# Patient Record
Sex: Female | Born: 1955
Health system: Southern US, Community
[De-identification: ages and names within clinical notes are randomized; demographics above are authoritative.]

## PROBLEM LIST (undated history)

## (undated) DIAGNOSIS — G629 Polyneuropathy, unspecified: Secondary | ICD-10-CM

## (undated) DIAGNOSIS — S82899A Other fracture of unspecified lower leg, initial encounter for closed fracture: Secondary | ICD-10-CM

## (undated) DIAGNOSIS — I10 Essential (primary) hypertension: Secondary | ICD-10-CM

## (undated) DIAGNOSIS — E119 Type 2 diabetes mellitus without complications: Secondary | ICD-10-CM

## (undated) DIAGNOSIS — K5790 Diverticulosis of intestine, part unspecified, without perforation or abscess without bleeding: Secondary | ICD-10-CM

## (undated) DIAGNOSIS — R7303 Prediabetes: Secondary | ICD-10-CM

## (undated) DIAGNOSIS — M069 Rheumatoid arthritis, unspecified: Secondary | ICD-10-CM

## (undated) HISTORY — PX: BREAST SURGERY: SHX581

## (undated) HISTORY — PX: BREAST CYST EXCISION: SHX579

## (undated) HISTORY — PX: NECK MASS EXCISION: SHX2079

## (undated) HISTORY — PX: CYST EXCISION: SHX5701

## (undated) HISTORY — PX: ABDOMINAL HYSTERECTOMY: SHX81

## (undated) HISTORY — PX: TOTAL KNEE ARTHROPLASTY: SHX125

---

## 2006-02-23 ENCOUNTER — Emergency Department: Payer: Self-pay | Admitting: Internal Medicine

## 2008-11-13 ENCOUNTER — Ambulatory Visit: Payer: Self-pay | Admitting: Family Medicine

## 2009-03-08 ENCOUNTER — Ambulatory Visit: Payer: Self-pay | Admitting: Rheumatology

## 2011-10-13 ENCOUNTER — Ambulatory Visit: Payer: Self-pay

## 2011-11-30 ENCOUNTER — Ambulatory Visit: Payer: Self-pay | Admitting: Surgery

## 2013-10-30 DIAGNOSIS — M069 Rheumatoid arthritis, unspecified: Secondary | ICD-10-CM | POA: Insufficient documentation

## 2013-10-30 DIAGNOSIS — M1990 Unspecified osteoarthritis, unspecified site: Secondary | ICD-10-CM | POA: Insufficient documentation

## 2013-10-30 DIAGNOSIS — K5790 Diverticulosis of intestine, part unspecified, without perforation or abscess without bleeding: Secondary | ICD-10-CM | POA: Insufficient documentation

## 2013-11-27 ENCOUNTER — Ambulatory Visit: Payer: Self-pay | Admitting: Rheumatology

## 2013-12-27 ENCOUNTER — Ambulatory Visit: Payer: Self-pay | Admitting: Physical Medicine and Rehabilitation

## 2014-01-16 DIAGNOSIS — M4317 Spondylolisthesis, lumbosacral region: Secondary | ICD-10-CM | POA: Insufficient documentation

## 2014-01-16 DIAGNOSIS — M51369 Other intervertebral disc degeneration, lumbar region without mention of lumbar back pain or lower extremity pain: Secondary | ICD-10-CM | POA: Insufficient documentation

## 2015-01-13 ENCOUNTER — Other Ambulatory Visit: Payer: Self-pay | Admitting: Surgery

## 2015-01-13 DIAGNOSIS — R2231 Localized swelling, mass and lump, right upper limb: Secondary | ICD-10-CM

## 2015-01-22 ENCOUNTER — Ambulatory Visit
Admission: RE | Admit: 2015-01-22 | Discharge: 2015-01-22 | Disposition: A | Discharge status: Home / Self Care | Payer: BLUE CROSS/BLUE SHIELD | Source: Ambulatory Visit | Attending: Surgery | Admitting: Surgery

## 2015-01-22 DIAGNOSIS — R2231 Localized swelling, mass and lump, right upper limb: Secondary | ICD-10-CM | POA: Insufficient documentation

## 2015-01-22 MED ORDER — GADOBENATE DIMEGLUMINE 529 MG/ML IV SOLN
20.0000 mL | Freq: Once | INTRAVENOUS | Status: AC | PRN
  Administered 2015-01-22: 20 mL via INTRAVENOUS

## 2015-03-21 ENCOUNTER — Other Ambulatory Visit: Payer: Self-pay | Admitting: Surgery

## 2015-03-21 DIAGNOSIS — N63 Unspecified lump in unspecified breast: Secondary | ICD-10-CM

## 2015-04-02 ENCOUNTER — Other Ambulatory Visit: Payer: Self-pay | Admitting: Surgery

## 2015-04-02 ENCOUNTER — Ambulatory Visit: Payer: BLUE CROSS/BLUE SHIELD

## 2015-04-02 ENCOUNTER — Ambulatory Visit
Admission: RE | Admit: 2015-04-02 | Discharge: 2015-04-02 | Disposition: A | Discharge status: Home / Self Care | Payer: BLUE CROSS/BLUE SHIELD | Source: Ambulatory Visit | Attending: Surgery | Admitting: Surgery

## 2015-04-02 DIAGNOSIS — N63 Unspecified lump in unspecified breast: Secondary | ICD-10-CM

## 2015-04-02 DIAGNOSIS — N611 Abscess of the breast and nipple: Secondary | ICD-10-CM | POA: Diagnosis not present

## 2016-07-09 ENCOUNTER — Emergency Department
Admission: EM | Admit: 2016-07-09 | Discharge: 2016-07-09 | Disposition: A | Discharge status: Home / Self Care | Payer: BLUE CROSS/BLUE SHIELD | Attending: Emergency Medicine | Admitting: Emergency Medicine

## 2016-07-09 ENCOUNTER — Emergency Department: Payer: BLUE CROSS/BLUE SHIELD

## 2016-07-09 ENCOUNTER — Encounter: Payer: Self-pay | Admitting: Emergency Medicine

## 2016-07-09 DIAGNOSIS — M1712 Unilateral primary osteoarthritis, left knee: Secondary | ICD-10-CM | POA: Insufficient documentation

## 2016-07-09 DIAGNOSIS — Z79899 Other long term (current) drug therapy: Secondary | ICD-10-CM | POA: Insufficient documentation

## 2016-07-09 DIAGNOSIS — M25562 Pain in left knee: Secondary | ICD-10-CM | POA: Diagnosis present

## 2016-07-09 MED ORDER — HYDROCODONE-ACETAMINOPHEN 5-325 MG PO TABS
1.0000 | ORAL_TABLET | Freq: Once | ORAL | Status: AC
  Administered 2016-07-09: 1 via ORAL
  Filled 2016-07-09: qty 1

## 2016-07-09 MED ORDER — HYDROCODONE-ACETAMINOPHEN 5-325 MG PO TABS: 1.0000 | ORAL_TABLET | ORAL | 0 refills | Status: DC | PRN

## 2016-07-09 MED ORDER — KETOROLAC TROMETHAMINE 10 MG PO TABS: 10.0000 mg | ORAL_TABLET | Freq: Four times a day (QID) | ORAL | 0 refills | Status: DC | PRN

## 2016-07-09 MED ORDER — KETOROLAC TROMETHAMINE 30 MG/ML IJ SOLN
30.0000 mg | Freq: Once | INTRAMUSCULAR | Status: AC
  Administered 2016-07-09: 30 mg via INTRAMUSCULAR
  Filled 2016-07-09: qty 1

## 2016-07-09 NOTE — Discharge Instructions (Signed)
Please follow up with the orthopedist.  Rest, ice, and elevate your left leg when possible. Stop the ibuprofen while you are taking the ketorolac.  Return to the ER for symptoms that change or worsen or for new concerns.

## 2016-07-09 NOTE — ED Triage Notes (Signed)
Pt to ed with c/o left knee pain and swelling x 1 month, denies new injury.

## 2016-07-09 NOTE — ED Provider Notes (Signed)
[facility] Emergency Department Provider Note ____________________________________________  Time seen: Approximately 12:47 PM  I have reviewed the triage vital signs and the nursing notes.   HISTORY  Chief Complaint Knee Pain    HPI Ellen Garcia is a 61 y.o. female who presents to the emergency department for evaluation of knee pain that has been present for the past month. She has been evaluated by orthopedics and given a steroid injection about a month ago. No new injury. She has been taking ibuprofen without relief.   History reviewed. No pertinent past medical history.  There are no active problems to display for this patient.   History reviewed. No pertinent surgical history.  Prior to Admission medications   Medication Sig Start Date End Date Taking? Authorizing Provider  gabapentin (NEURONTIN) 300 MG capsule Take 300 mg by mouth 3 (three) times daily.   Yes Historical Provider, MD  ibuprofen (ADVIL,MOTRIN) 800 MG tablet Take 800 mg by mouth every 8 (eight) hours as needed.   Yes Historical Provider, MD  HYDROcodone-acetaminophen (NORCO/VICODIN) 5-325 MG tablet Take 1 tablet by mouth every 4 (four) hours as needed for moderate pain. 07/09/16 07/09/17  Chinita Pester, FNP  ketorolac (TORADOL) 10 MG tablet Take 1 tablet (10 mg total) by mouth every 6 (six) hours as needed. 07/09/16   Chinita Pester, FNP    Allergies Penicillins  No family history on file.  Social History Social History  Substance Use Topics  . Smoking status: Never Smoker  . Smokeless tobacco: Never Used  . Alcohol use Yes    Review of Systems Constitutional: No recent illness. Cardiovascular: Denies chest pain or palpitations. Respiratory: Denies shortness of breath. Musculoskeletal: Pain in left knee. Skin: Negative for rash, wound, lesion. Neurological: Negative for focal weakness or numbness.  ____________________________________________   PHYSICAL  EXAM:  VITAL SIGNS: ED Triage Vitals  Enc Vitals Group     BP 07/09/16 1010 (!) 164/100     Pulse Rate 07/09/16 1010 85     Resp 07/09/16 1010 16     Temp 07/09/16 1010 98.2 F (36.8 C)     Temp Source 07/09/16 1010 Oral     SpO2 07/09/16 1010 99 %     Weight 07/09/16 1009 210 lb (95.3 kg)     Height 07/09/16 1012 5\' 7"  (1.702 m)     Head Circumference --      Peak Flow --      Pain Score 07/09/16 1009 10     Pain Loc --      Pain Edu? --      Excl. in GC? --     Constitutional: Alert and oriented. Well appearing and in no acute distress. Eyes: Conjunctivae are normal. EOMI. Head: Atraumatic. Neck: No stridor.  Respiratory: Normal respiratory effort.   Musculoskeletal:Left knee: Stable joint. Diffusely tender to palpation, worse over over the lateral aspect.  Neurologic:  Normal speech and language. No gross focal neurologic deficits are appreciated. Speech is normal. No gait instability. Skin:  Skin is warm, dry and intact. Atraumatic. Psychiatric: Mood and affect are normal. Speech and behavior are normal.  ____________________________________________   LABS (all labs ordered are listed, but only abnormal results are displayed)  Labs Reviewed - No data to display ____________________________________________  RADIOLOGY  Tricompartmental arthritis of the left knee. I, 09/08/16, personally viewed and evaluated these images (plain radiographs) as part of my medical decision making, as well as reviewing the written report by the radiologist.  ____________________________________________   PROCEDURES  Procedure(s) performed: Yetta Barre wrap  ____________________________________________   INITIAL IMPRESSION / ASSESSMENT AND PLAN / ED COURSE  61 year old female presenting to the emergency department for evaluation of left knee pain. X-ray reveals tricompartmental osteoarthritis. Jones wrap was applied to the left knee. Patient was neurovascularly intact post-up with  patient. Patient is scheduled to follow-up with orthopedics on 07/19/2016 and she was encouraged to keep that appointment as scheduled. She was encouraged to continue using her cane and she was advised to rest, ice, and elevate her left leg as often as possible throughout the day. Patient was instructed to stop the ibuprofen as long as she is taking the Toradol. She was instructed to return to the emergency department for symptoms that change or worsen if she is unable to see the orthopedist or primary care provider.  Pertinent labs & imaging results that were available during my care of the patient were reviewed by me and considered in my medical decision making (see chart for details).  _________________________________________   FINAL CLINICAL IMPRESSION(S) / ED DIAGNOSES  Final diagnoses:  Osteoarthritis of left knee, unspecified osteoarthritis type    Discharge Medication List as of 07/09/2016 11:28 AM    START taking these medications   Details  HYDROcodone-acetaminophen (NORCO/VICODIN) 5-325 MG tablet Take 1 tablet by mouth every 4 (four) hours as needed for moderate pain., Starting Fri 07/09/2016, Until Sat 07/09/2017, Print    ketorolac (TORADOL) 10 MG tablet Take 1 tablet (10 mg total) by mouth every 6 (six) hours as needed., Starting Fri 07/09/2016, Print        If controlled substance prescribed during this visit, 12 month history viewed on the [facility] prior to issuing an initial prescription for Schedule II or III opiod.    Chinita Pester, FNP 07/09/16 1301    Jene Every, MD 07/09/16 1434

## 2016-07-09 NOTE — ED Notes (Signed)
See triage note  States she has neuropathy to both legs .  Developed increased pain to left knee over the past few days   Denies any injury and states she thinks her knee is swollen .  Has appt with [facility] on the 16 th

## 2016-09-02 ENCOUNTER — Other Ambulatory Visit: Payer: Self-pay | Admitting: Orthopedic Surgery

## 2016-09-02 DIAGNOSIS — M1712 Unilateral primary osteoarthritis, left knee: Secondary | ICD-10-CM

## 2017-01-06 ENCOUNTER — Ambulatory Visit
Admission: RE | Admit: 2017-01-06 | Discharge: 2017-01-06 | Disposition: A | Discharge status: Home / Self Care | Payer: BLUE CROSS/BLUE SHIELD | Source: Ambulatory Visit | Attending: Orthopedic Surgery | Admitting: Orthopedic Surgery

## 2017-01-06 DIAGNOSIS — M25462 Effusion, left knee: Secondary | ICD-10-CM | POA: Insufficient documentation

## 2017-01-06 DIAGNOSIS — M1712 Unilateral primary osteoarthritis, left knee: Secondary | ICD-10-CM | POA: Diagnosis present

## 2017-01-19 ENCOUNTER — Encounter
Admission: RE | Admit: 2017-01-19 | Discharge: 2017-01-19 | Disposition: A | Discharge status: Home / Self Care | Payer: BLUE CROSS/BLUE SHIELD | Source: Ambulatory Visit | Attending: Orthopedic Surgery | Admitting: Orthopedic Surgery

## 2017-01-19 DIAGNOSIS — Z87891 Personal history of nicotine dependence: Secondary | ICD-10-CM | POA: Insufficient documentation

## 2017-01-19 DIAGNOSIS — M1712 Unilateral primary osteoarthritis, left knee: Secondary | ICD-10-CM | POA: Insufficient documentation

## 2017-01-19 DIAGNOSIS — Z01818 Encounter for other preprocedural examination: Secondary | ICD-10-CM | POA: Diagnosis not present

## 2017-01-19 DIAGNOSIS — Z01810 Encounter for preprocedural cardiovascular examination: Secondary | ICD-10-CM | POA: Insufficient documentation

## 2017-01-19 DIAGNOSIS — E669 Obesity, unspecified: Secondary | ICD-10-CM | POA: Diagnosis not present

## 2017-01-19 DIAGNOSIS — R030 Elevated blood-pressure reading, without diagnosis of hypertension: Secondary | ICD-10-CM | POA: Diagnosis not present

## 2017-01-19 HISTORY — DX: Rheumatoid arthritis, unspecified: M06.9

## 2017-01-19 HISTORY — DX: Polyneuropathy, unspecified: G62.9

## 2017-01-19 LAB — TYPE AND SCREEN
ABO/RH(D): O NEG
ANTIBODY SCREEN: NEGATIVE

## 2017-01-19 LAB — URINALYSIS, ROUTINE W REFLEX MICROSCOPIC
Bilirubin Urine: NEGATIVE
GLUCOSE, UA: NEGATIVE mg/dL
Hgb urine dipstick: NEGATIVE
Ketones, ur: NEGATIVE mg/dL
LEUKOCYTES UA: NEGATIVE
Nitrite: NEGATIVE
PH: 5 (ref 5.0–8.0)
PROTEIN: NEGATIVE mg/dL
Specific Gravity, Urine: 1.019 (ref 1.005–1.030)

## 2017-01-19 LAB — CBC
HEMATOCRIT: 35.1 % (ref 35.0–47.0)
Hemoglobin: 11.8 g/dL — ABNORMAL LOW (ref 12.0–16.0)
MCH: 29.3 pg (ref 26.0–34.0)
MCHC: 33.6 g/dL (ref 32.0–36.0)
MCV: 87.2 fL (ref 80.0–100.0)
Platelets: 272 10*3/uL (ref 150–440)
RBC: 4.02 MIL/uL (ref 3.80–5.20)
RDW: 13.4 % (ref 11.5–14.5)
WBC: 4.7 10*3/uL (ref 3.6–11.0)

## 2017-01-19 LAB — BASIC METABOLIC PANEL
ANION GAP: 8 (ref 5–15)
BUN: 16 mg/dL (ref 6–20)
CO2: 27 mmol/L (ref 22–32)
Calcium: 9.3 mg/dL (ref 8.9–10.3)
Chloride: 107 mmol/L (ref 101–111)
Creatinine, Ser: 0.68 mg/dL (ref 0.44–1.00)
GFR calc Af Amer: >60 mL/min (ref >60)
Glucose, Bld: 112 mg/dL — ABNORMAL HIGH (ref 65–99)
POTASSIUM: 3.7 mmol/L (ref 3.5–5.1)
SODIUM: 142 mmol/L (ref 135–145)

## 2017-01-19 LAB — SURGICAL PCR SCREEN
MRSA, PCR: NEGATIVE
Staphylococcus aureus: NEGATIVE

## 2017-01-19 LAB — SEDIMENTATION RATE: Sed Rate: 26 mm/hr (ref 0–30)

## 2017-01-19 LAB — PROTIME-INR
INR: 0.94
Prothrombin Time: 12.5 seconds (ref 11.4–15.2)

## 2017-01-19 LAB — APTT: aPTT: 30 seconds (ref 24–36)

## 2017-01-19 NOTE — Patient Instructions (Signed)
Your procedure is scheduled on: Tuesday February 01, 2017 Report to Same Day Surgery on the 2nd floor in the [facility]. To find out your arrival time, please call [phone] between 1PM - 3PM YN:[id number] January 31, 2017  REMEMBER: Instructions that are not followed completely may result in serious medical risk up to and including death; or upon the discretion of your surgeon and anesthesiologist your surgery may need to be rescheduled.  Do not eat food or drink liquids after midnight. No gum chewing or hard candies.  You may however, drink CLEAR liquids up to 2 hours before you are scheduled to arrive at the hospital for your procedure.  Do not drink clear liquids within 2 hours of your scheduled arrival to the hospital as this may lead to your procedure being delayed or rescheduled.  Clear liquids include: - water  - apple juice without pulp - clear gatorade - black coffee or tea (NO milk, creamers, sugars) DO NOT drink anything not on this list.  No Alcohol for 24 hours before or after surgery.  No Smoking for 24 hours prior to surgery.  Notify your doctor if there is any change in your medical condition (cold, fever, infection).  Do not wear jewelry, make-up, hairpins, clips or nail polish.  Do not wear lotions, powders, or perfumes.   Do not shave 48 hours prior to surgery. Men may shave face and neck.  Contacts and dentures may not be worn into surgery.  Do not bring valuables to the hospital. [facility] is not responsible for any belongings or valuables.   TAKE THESE MEDICATIONS THE MORNING OF SURGERY WITH A SIP OF WATER: NONE    Use CHG Soap or wipes as directed on instruction sheet.  Stop Anti-inflammatories ON January 25, 2017  such as Advil, Aleve, Ibuprofen, Motrin, Naproxen, Naprosyn, Goodie powder, or aspirin products. (May take Tylenol or Acetaminophen and Celebrex if needed.)  Stop supplements until after surgery. (May continue Vitamin D, Vitamin B,  CALCIUM and multivitamin.)  If you are being admitted to the hospital overnight, leave your suitcase in the car. After surgery it may be brought to your room.  If you are being discharged the day of surgery, you will not be allowed to drive home. You will need someone to drive you home and stay with you that night.   If you are taking public transportation, you will need to have a responsible adult to with you.  Please call the number above if you have any questions about these instructions.

## 2017-01-20 LAB — URINE CULTURE: Culture: NO GROWTH

## 2017-01-31 MED ORDER — CLINDAMYCIN PHOSPHATE 900 MG/50ML IV SOLN
900.0000 mg | Freq: Once | INTRAVENOUS | Status: AC
  Administered 2017-02-01: 900 mg via INTRAVENOUS

## 2017-02-01 ENCOUNTER — Inpatient Hospital Stay: Payer: BLUE CROSS/BLUE SHIELD

## 2017-02-01 ENCOUNTER — Ambulatory Visit: Payer: BLUE CROSS/BLUE SHIELD | Admitting: Anesthesiology

## 2017-02-01 ENCOUNTER — Encounter: Payer: Self-pay | Admitting: *Deleted

## 2017-02-01 ENCOUNTER — Encounter
Admission: RE | Disposition: A | Discharge status: Skilled Nursing Facility | Payer: Self-pay | Source: Ambulatory Visit | Attending: Orthopedic Surgery

## 2017-02-01 ENCOUNTER — Inpatient Hospital Stay
Admission: RE | Admit: 2017-02-01 | Discharge: 2017-02-03 | DRG: 470 | Disposition: A | Discharge status: Skilled Nursing Facility | Payer: BLUE CROSS/BLUE SHIELD | Source: Ambulatory Visit | Attending: Orthopedic Surgery | Admitting: Orthopedic Surgery

## 2017-02-01 DIAGNOSIS — M1712 Unilateral primary osteoarthritis, left knee: Secondary | ICD-10-CM | POA: Diagnosis present

## 2017-02-01 DIAGNOSIS — G8918 Other acute postprocedural pain: Secondary | ICD-10-CM

## 2017-02-01 DIAGNOSIS — D62 Acute posthemorrhagic anemia: Secondary | ICD-10-CM | POA: Diagnosis not present

## 2017-02-01 DIAGNOSIS — I10 Essential (primary) hypertension: Secondary | ICD-10-CM | POA: Diagnosis present

## 2017-02-01 DIAGNOSIS — M069 Rheumatoid arthritis, unspecified: Secondary | ICD-10-CM | POA: Diagnosis present

## 2017-02-01 DIAGNOSIS — Z87891 Personal history of nicotine dependence: Secondary | ICD-10-CM

## 2017-02-01 DIAGNOSIS — Z6841 Body Mass Index (BMI) 40.0 and over, adult: Secondary | ICD-10-CM | POA: Diagnosis not present

## 2017-02-01 DIAGNOSIS — G629 Polyneuropathy, unspecified: Secondary | ICD-10-CM | POA: Diagnosis present

## 2017-02-01 DIAGNOSIS — M25562 Pain in left knee: Secondary | ICD-10-CM | POA: Diagnosis present

## 2017-02-01 HISTORY — PX: TOTAL KNEE ARTHROPLASTY: SHX125

## 2017-02-01 LAB — ABO/RH: ABO/RH(D): O NEG

## 2017-02-01 LAB — CREATININE, SERUM
Creatinine, Ser: 0.72 mg/dL (ref 0.44–1.00)
GFR calc Af Amer: >60 mL/min (ref >60)
GFR calc non Af Amer: >60 mL/min (ref >60)

## 2017-02-01 LAB — CBC
HCT: 34.2 % — ABNORMAL LOW (ref 35.0–47.0)
Hemoglobin: 11.2 g/dL — ABNORMAL LOW (ref 12.0–16.0)
MCH: 28.8 pg (ref 26.0–34.0)
MCHC: 32.7 g/dL (ref 32.0–36.0)
MCV: 88.1 fL (ref 80.0–100.0)
Platelets: 281 K/uL (ref 150–440)
RBC: 3.89 MIL/uL (ref 3.80–5.20)
RDW: 13.2 % (ref 11.5–14.5)
WBC: 8.1 K/uL (ref 3.6–11.0)

## 2017-02-01 SURGERY — ARTHROPLASTY, KNEE, TOTAL
Anesthesia: Spinal | Site: Knee | Laterality: Left | Wound class: Clean

## 2017-02-01 MED ORDER — ACETAMINOPHEN 500 MG PO TABS
1000.0000 mg | ORAL_TABLET | Freq: Four times a day (QID) | ORAL | Status: AC
  Administered 2017-02-01 – 2017-02-02 (×4): 1000 mg via ORAL
  Filled 2017-02-01 (×4): qty 2

## 2017-02-01 MED ORDER — PHENOL 1.4 % MT LIQD
1.0000 | OROMUCOSAL | Status: DC | PRN
  Filled 2017-02-01: qty 177

## 2017-02-01 MED ORDER — BIOTIN 1000 MCG PO TABS: 1000.0000 ug | ORAL_TABLET | Freq: Every day | ORAL | Status: DC

## 2017-02-01 MED ORDER — MORPHINE SULFATE 10 MG/ML IJ SOLN
INTRAMUSCULAR | Status: DC | PRN
  Administered 2017-02-01: 10 mg

## 2017-02-01 MED ORDER — MIDAZOLAM HCL 5 MG/5ML IJ SOLN
INTRAMUSCULAR | Status: DC | PRN
  Administered 2017-02-01: 2 mg via INTRAVENOUS

## 2017-02-01 MED ORDER — ONDANSETRON HCL 4 MG/2ML IJ SOLN
INTRAMUSCULAR | Status: AC
  Filled 2017-02-01: qty 2

## 2017-02-01 MED ORDER — LACTATED RINGERS IV SOLN
INTRAVENOUS | Status: DC
  Administered 2017-02-01 (×2): via INTRAVENOUS

## 2017-02-01 MED ORDER — ACETAMINOPHEN 325 MG PO TABS
650.0000 mg | ORAL_TABLET | ORAL | Status: DC | PRN
  Administered 2017-02-02 – 2017-02-03 (×2): 650 mg via ORAL
  Filled 2017-02-01 (×2): qty 2

## 2017-02-01 MED ORDER — ENOXAPARIN SODIUM 40 MG/0.4ML ~~LOC~~ SOLN
40.0000 mg | Freq: Two times a day (BID) | SUBCUTANEOUS | Status: DC
  Administered 2017-02-02 – 2017-02-03 (×3): 40 mg via SUBCUTANEOUS
  Filled 2017-02-01 (×3): qty 0.4

## 2017-02-01 MED ORDER — MORPHINE SULFATE (PF) 10 MG/ML IV SOLN
INTRAVENOUS | Status: AC
  Filled 2017-02-01: qty 1

## 2017-02-01 MED ORDER — BUPIVACAINE HCL (PF) 0.5 % IJ SOLN
INTRAMUSCULAR | Status: DC | PRN
  Administered 2017-02-01: 3 mL

## 2017-02-01 MED ORDER — ZOLPIDEM TARTRATE 5 MG PO TABS: 5.0000 mg | ORAL_TABLET | Freq: Every evening | ORAL | Status: DC | PRN

## 2017-02-01 MED ORDER — BISACODYL 10 MG RE SUPP
10.0000 mg | Freq: Every day | RECTAL | Status: DC | PRN
  Filled 2017-02-01: qty 1

## 2017-02-01 MED ORDER — PROMETHAZINE HCL 25 MG/ML IJ SOLN: 6.2500 mg | INTRAMUSCULAR | Status: DC | PRN

## 2017-02-01 MED ORDER — PROPOFOL 500 MG/50ML IV EMUL
INTRAVENOUS | Status: AC
  Filled 2017-02-01: qty 50

## 2017-02-01 MED ORDER — SODIUM CHLORIDE 0.9 % IV SOLN
INTRAVENOUS | Status: DC | PRN
  Administered 2017-02-01: 60 mL

## 2017-02-01 MED ORDER — SODIUM CHLORIDE 0.9 % IJ SOLN
INTRAMUSCULAR | Status: AC
  Filled 2017-02-01: qty 50

## 2017-02-01 MED ORDER — SEVOFLURANE IN SOLN
RESPIRATORY_TRACT | Status: AC
  Filled 2017-02-01: qty 250

## 2017-02-01 MED ORDER — INFLUENZA VAC SPLIT QUAD 0.5 ML IM SUSY: 0.5000 mL | PREFILLED_SYRINGE | INTRAMUSCULAR | Status: DC

## 2017-02-01 MED ORDER — MAGNESIUM HYDROXIDE 400 MG/5ML PO SUSP
30.0000 mL | Freq: Every day | ORAL | Status: DC | PRN
  Administered 2017-02-02 – 2017-02-03 (×2): 30 mL via ORAL
  Filled 2017-02-01 (×2): qty 30

## 2017-02-01 MED ORDER — PROPOFOL 10 MG/ML IV BOLUS
INTRAVENOUS | Status: DC | PRN
  Administered 2017-02-01: 50 mg via INTRAVENOUS

## 2017-02-01 MED ORDER — SODIUM CHLORIDE 0.9 % IV SOLN
INTRAVENOUS | Status: DC | PRN
  Administered 2017-02-01: 1000 mg via INTRAVENOUS

## 2017-02-01 MED ORDER — METOCLOPRAMIDE HCL 10 MG PO TABS
5.0000 mg | ORAL_TABLET | Freq: Three times a day (TID) | ORAL | Status: DC | PRN
  Administered 2017-02-02: 10 mg via ORAL
  Filled 2017-02-01: qty 1

## 2017-02-01 MED ORDER — KETOROLAC TROMETHAMINE 30 MG/ML IJ SOLN
INTRAMUSCULAR | Status: DC | PRN
  Administered 2017-02-01: 30 mg

## 2017-02-01 MED ORDER — MENTHOL 3 MG MT LOZG
1.0000 | LOZENGE | OROMUCOSAL | Status: DC | PRN
  Filled 2017-02-01: qty 9

## 2017-02-01 MED ORDER — SODIUM CHLORIDE 0.9 % IV SOLN
INTRAVENOUS | Status: DC | PRN
  Administered 2017-02-01: 20 ug/min via INTRAVENOUS

## 2017-02-01 MED ORDER — CLINDAMYCIN PHOSPHATE 900 MG/50ML IV SOLN
INTRAVENOUS | Status: AC
  Filled 2017-02-01: qty 50

## 2017-02-01 MED ORDER — NEOMYCIN-POLYMYXIN B GU 40-200000 IR SOLN
Status: DC | PRN
  Administered 2017-02-01: 16 mL

## 2017-02-01 MED ORDER — FENTANYL CITRATE (PF) 100 MCG/2ML IJ SOLN: 25.0000 ug | INTRAMUSCULAR | Status: DC | PRN

## 2017-02-01 MED ORDER — METOCLOPRAMIDE HCL 5 MG/ML IJ SOLN: 5.0000 mg | Freq: Three times a day (TID) | INTRAMUSCULAR | Status: DC | PRN

## 2017-02-01 MED ORDER — TIZANIDINE HCL 4 MG PO TABS
4.0000 mg | ORAL_TABLET | Freq: Every day | ORAL | Status: DC
  Administered 2017-02-01 – 2017-02-02 (×2): 4 mg via ORAL
  Filled 2017-02-01 (×3): qty 1

## 2017-02-01 MED ORDER — CLINDAMYCIN PHOSPHATE 900 MG/50ML IV SOLN
900.0000 mg | Freq: Four times a day (QID) | INTRAVENOUS | Status: AC
  Administered 2017-02-01 – 2017-02-02 (×3): 900 mg via INTRAVENOUS
  Filled 2017-02-01 (×4): qty 50

## 2017-02-01 MED ORDER — ONDANSETRON HCL 4 MG PO TABS: 4.0000 mg | ORAL_TABLET | Freq: Four times a day (QID) | ORAL | Status: DC | PRN

## 2017-02-01 MED ORDER — ACETAMINOPHEN 650 MG RE SUPP: 650.0000 mg | RECTAL | Status: DC | PRN

## 2017-02-01 MED ORDER — BUPIVACAINE-EPINEPHRINE (PF) 0.25% -1:200000 IJ SOLN
INTRAMUSCULAR | Status: AC
  Filled 2017-02-01: qty 30

## 2017-02-01 MED ORDER — BUPIVACAINE LIPOSOME 1.3 % IJ SUSP
INTRAMUSCULAR | Status: AC
  Filled 2017-02-01: qty 20

## 2017-02-01 MED ORDER — ENOXAPARIN SODIUM 30 MG/0.3ML ~~LOC~~ SOLN: 30.0000 mg | Freq: Two times a day (BID) | SUBCUTANEOUS | Status: DC

## 2017-02-01 MED ORDER — OXYCODONE HCL 5 MG PO TABS
10.0000 mg | ORAL_TABLET | ORAL | Status: DC | PRN
  Administered 2017-02-01 – 2017-02-03 (×6): 10 mg via ORAL
  Filled 2017-02-01 (×6): qty 2

## 2017-02-01 MED ORDER — BUPIVACAINE HCL (PF) 0.5 % IJ SOLN
INTRAMUSCULAR | Status: AC
  Filled 2017-02-01: qty 10

## 2017-02-01 MED ORDER — DOCUSATE SODIUM 100 MG PO CAPS
100.0000 mg | ORAL_CAPSULE | Freq: Two times a day (BID) | ORAL | Status: DC
  Administered 2017-02-01 – 2017-02-03 (×4): 100 mg via ORAL
  Filled 2017-02-01 (×4): qty 1

## 2017-02-01 MED ORDER — LORATADINE 10 MG PO TABS
10.0000 mg | ORAL_TABLET | Freq: Every day | ORAL | Status: DC
  Administered 2017-02-02 – 2017-02-03 (×2): 10 mg via ORAL
  Filled 2017-02-01 (×2): qty 1

## 2017-02-01 MED ORDER — PROPOFOL 10 MG/ML IV BOLUS
INTRAVENOUS | Status: AC
  Filled 2017-02-01: qty 20

## 2017-02-01 MED ORDER — OXYCODONE HCL 5 MG PO TABS
5.0000 mg | ORAL_TABLET | ORAL | Status: DC | PRN
  Administered 2017-02-02 (×2): 5 mg via ORAL
  Filled 2017-02-01 (×4): qty 1

## 2017-02-01 MED ORDER — TOTAL B/C PO TABS
1.0000 | ORAL_TABLET | Freq: Every day | ORAL | Status: DC
  Filled 2017-02-01 (×3): qty 1

## 2017-02-01 MED ORDER — SODIUM CHLORIDE 0.9 % IJ SOLN
INTRAMUSCULAR | Status: DC | PRN
  Administered 2017-02-01: 28 mL

## 2017-02-01 MED ORDER — GABAPENTIN 100 MG PO CAPS
200.0000 mg | ORAL_CAPSULE | Freq: Three times a day (TID) | ORAL | Status: DC
  Administered 2017-02-01 – 2017-02-03 (×7): 200 mg via ORAL
  Filled 2017-02-01 (×7): qty 2

## 2017-02-01 MED ORDER — FAMOTIDINE 20 MG PO TABS
ORAL_TABLET | ORAL | Status: AC
  Administered 2017-02-01: 20 mg via ORAL
  Filled 2017-02-01: qty 1

## 2017-02-01 MED ORDER — MORPHINE SULFATE (PF) 2 MG/ML IV SOLN
2.0000 mg | INTRAVENOUS | Status: DC | PRN
  Administered 2017-02-01 – 2017-02-02 (×3): 2 mg via INTRAVENOUS
  Filled 2017-02-01 (×3): qty 1

## 2017-02-01 MED ORDER — DIPHENHYDRAMINE HCL 12.5 MG/5ML PO ELIX: 12.5000 mg | ORAL_SOLUTION | ORAL | Status: DC | PRN

## 2017-02-01 MED ORDER — ADULT MULTIVITAMIN W/MINERALS CH
1.0000 | ORAL_TABLET | Freq: Every day | ORAL | Status: DC
  Administered 2017-02-03: 1 via ORAL
  Filled 2017-02-01 (×2): qty 1

## 2017-02-01 MED ORDER — PROPOFOL 500 MG/50ML IV EMUL
INTRAVENOUS | Status: DC | PRN
  Administered 2017-02-01: 75 ug/kg/min via INTRAVENOUS

## 2017-02-01 MED ORDER — MIDAZOLAM HCL 2 MG/2ML IJ SOLN
INTRAMUSCULAR | Status: AC
  Filled 2017-02-01: qty 2

## 2017-02-01 MED ORDER — B COMPLEX PO TABS: 1.0000 | ORAL_TABLET | Freq: Every day | ORAL | Status: DC

## 2017-02-01 MED ORDER — BUPIVACAINE-EPINEPHRINE (PF) 0.25% -1:200000 IJ SOLN
INTRAMUSCULAR | Status: DC | PRN
  Administered 2017-02-01: 30 mL

## 2017-02-01 MED ORDER — NEOMYCIN-POLYMYXIN B GU 40-200000 IR SOLN
Status: AC
  Filled 2017-02-01: qty 20

## 2017-02-01 MED ORDER — SODIUM CHLORIDE 0.9 % IV SOLN
INTRAVENOUS | Status: DC
  Administered 2017-02-01 – 2017-02-02 (×2): via INTRAVENOUS

## 2017-02-01 MED ORDER — ONDANSETRON HCL 4 MG/2ML IJ SOLN
4.0000 mg | Freq: Four times a day (QID) | INTRAMUSCULAR | Status: DC | PRN
  Administered 2017-02-01: 4 mg via INTRAVENOUS
  Filled 2017-02-01: qty 2

## 2017-02-01 MED ORDER — FAMOTIDINE 20 MG PO TABS
20.0000 mg | ORAL_TABLET | Freq: Once | ORAL | Status: AC
  Administered 2017-02-01: 20 mg via ORAL

## 2017-02-01 MED ORDER — TRANEXAMIC ACID 1000 MG/10ML IV SOLN
1000.0000 mg | INTRAVENOUS | Status: AC
  Filled 2017-02-01: qty 10

## 2017-02-01 MED ORDER — CALCIUM-VITAMIN D 500-200 MG-UNIT PO TABS
ORAL_TABLET | Freq: Every day | ORAL | Status: DC
  Administered 2017-02-01 – 2017-02-02 (×2): 1 via ORAL
  Filled 2017-02-01 (×3): qty 1

## 2017-02-01 SURGICAL SUPPLY — 64 items
BANDAGE ACE 6X5 VEL STRL LF (GAUZE/BANDAGES/DRESSINGS) ×3 IMPLANT
BLADE SAW 1 (BLADE) ×3 IMPLANT
BLOCK CUTTING FEMUR 3+ LT MED (MISCELLANEOUS) IMPLANT
BLOCK CUTTING TIBIAL 3 LT (MISCELLANEOUS) IMPLANT
CANISTER SUCT 1200ML W/VALVE (MISCELLANEOUS) ×3 IMPLANT
CANISTER SUCT 3000ML PPV (MISCELLANEOUS) ×6 IMPLANT
CAPT KNEE TOTAL 3 ×3 IMPLANT
CATH FOL LEG HOLDER (MISCELLANEOUS) ×3 IMPLANT
CATH TRAY METER 16FR LF (MISCELLANEOUS) ×3 IMPLANT
CEMENT HV SMART SET (Cement) ×6 IMPLANT
CHLORAPREP W/TINT 26ML (MISCELLANEOUS) ×6 IMPLANT
COOLER POLAR GLACIER W/PUMP (MISCELLANEOUS) ×3 IMPLANT
CUFF TOURN 24 STER (MISCELLANEOUS) IMPLANT
CUFF TOURN 30 STER DUAL PORT (MISCELLANEOUS) IMPLANT
CUFF TOURN 34 STER (MISCELLANEOUS) ×3 IMPLANT
DRAPE SHEET LG 3/4 BI-LAMINATE (DRAPES) ×6 IMPLANT
ELECT CAUTERY BLADE 6.4 (BLADE) ×3 IMPLANT
ELECT REM PT RETURN 9FT ADLT (ELECTROSURGICAL) ×3
ELECTRODE REM PT RTRN 9FT ADLT (ELECTROSURGICAL) ×1 IMPLANT
GAUZE PETRO XEROFOAM 1X8 (MISCELLANEOUS) ×3 IMPLANT
GAUZE SPONGE 4X4 12PLY STRL (GAUZE/BANDAGES/DRESSINGS) ×3 IMPLANT
GLOVE BIOGEL PI IND STRL 9 (GLOVE) ×1 IMPLANT
GLOVE BIOGEL PI INDICATOR 9 (GLOVE) ×2
GLOVE INDICATOR 8.0 STRL GRN (GLOVE) ×3 IMPLANT
GLOVE SURG ORTHO 8.0 STRL STRW (GLOVE) ×3 IMPLANT
GLOVE SURG SYN 9.0  PF PI (GLOVE) ×2
GLOVE SURG SYN 9.0 PF PI (GLOVE) ×1 IMPLANT
GOWN SRG 2XL LVL 4 RGLN SLV (GOWNS) ×1 IMPLANT
GOWN STRL NON-REIN 2XL LVL4 (GOWNS) ×2
GOWN STRL REUS W/ TWL LRG LVL3 (GOWN DISPOSABLE) ×1 IMPLANT
GOWN STRL REUS W/ TWL XL LVL3 (GOWN DISPOSABLE) ×1 IMPLANT
GOWN STRL REUS W/TWL LRG LVL3 (GOWN DISPOSABLE) ×2
GOWN STRL REUS W/TWL XL LVL3 (GOWN DISPOSABLE) ×2
HOOD PEEL AWAY FLYTE STAYCOOL (MISCELLANEOUS) ×6 IMPLANT
IMMBOLIZER KNEE 19 BLUE UNIV (SOFTGOODS) ×3 IMPLANT
KIT PREVENA INCISION MGT 13 (CANNISTER) ×3 IMPLANT
KIT RM TURNOVER STRD PROC AR (KITS) ×3 IMPLANT
KNEE MEDACTA TIBIAL/FEMORAL BL (Knees) ×3 IMPLANT
KNIFE SCULPS 14X20 (INSTRUMENTS) ×3 IMPLANT
NDL SAFETY 18GX1.5 (NEEDLE) ×3 IMPLANT
NEEDLE SPNL 18GX3.5 QUINCKE PK (NEEDLE) ×3 IMPLANT
NEEDLE SPNL 20GX3.5 QUINCKE YW (NEEDLE) ×3 IMPLANT
NS IRRIG 1000ML POUR BTL (IV SOLUTION) ×3 IMPLANT
PACK TOTAL KNEE (MISCELLANEOUS) ×3 IMPLANT
PAD WRAPON POLAR KNEE (MISCELLANEOUS) ×1 IMPLANT
PULSAVAC PLUS IRRIG FAN TIP (DISPOSABLE) ×3
SOL .9 NS 3000ML IRR  AL (IV SOLUTION) ×2
SOL .9 NS 3000ML IRR UROMATIC (IV SOLUTION) ×1 IMPLANT
STAPLER SKIN PROX 35W (STAPLE) ×3 IMPLANT
SUCTION FRAZIER HANDLE 10FR (MISCELLANEOUS) ×2
SUCTION TUBE FRAZIER 10FR DISP (MISCELLANEOUS) ×1 IMPLANT
SUT DVC 2 QUILL PDO  T11 36X36 (SUTURE) ×2
SUT DVC 2 QUILL PDO T11 36X36 (SUTURE) ×1 IMPLANT
SUT V-LOC 90 ABS DVC 3-0 CL (SUTURE) ×3 IMPLANT
SYR 20CC LL (SYRINGE) ×3 IMPLANT
SYR 50ML LL SCALE MARK (SYRINGE) ×6 IMPLANT
TIBIAL BONE MODEL LEFT (MISCELLANEOUS) IMPLANT
TIP FAN IRRIG PULSAVAC PLUS (DISPOSABLE) ×1 IMPLANT
TOWEL OR 17X26 4PK STRL BLUE (TOWEL DISPOSABLE) ×3 IMPLANT
TOWER CARTRIDGE SMART MIX (DISPOSABLE) ×3 IMPLANT
TUBING CONNECTING 10 (TUBING) ×4 IMPLANT
TUBING CONNECTING 10' (TUBING) ×2
WND VAC CANISTER 500ML (MISCELLANEOUS) ×3 IMPLANT
WRAPON POLAR PAD KNEE (MISCELLANEOUS) ×3

## 2017-02-01 NOTE — Anesthesia Post-op Follow-up Note (Signed)
Anesthesia QCDR form completed.        

## 2017-02-01 NOTE — Care Management (Signed)
Spoke with patient, her husband and friend.  At present, physical therapy is recommending home with home health.  Patient does not have an agency preference.  [facility] has the referral.  discussed PT and OT consults with patient.  She does not have a bedside commode or a walker.  Orders present and called to [facility].  Lovenox 40mg  sq qd x 14 days no refills called into to [facility] [address].  Read back of order was correct.  Patient has zero dollar copay.  Confirmed contact information for referral

## 2017-02-01 NOTE — NC FL2 (Signed)
[place] MEDICAID FL2 LEVEL OF CARE SCREENING TOOL     IDENTIFICATION  Patient Name: Ellen Garcia Birthdate: [birth year] Sex: female Admission Date (Current Location): 02/01/2017  [place] and [place] Number:  Chiropodist and Address:  [facility], [address], [place], [place] [address]      Provider Number: [id number]  Attending Physician Name and Address:  Kennedy Bucker, MD  Relative Name and Phone Number:       Current Level of Care: Hospital Recommended Level of Care: Skilled Nursing Facility Prior Approval Number:    Date Approved/Denied:   PASRR Number:  [id number])  Discharge Plan: SNF    Current Diagnoses: Patient Active Problem List   Diagnosis Date Noted  . Primary localized osteoarthritis of left knee 02/01/2017    Orientation RESPIRATION BLADDER Height & Weight     Self, Time, Situation, Place  O2 (Nasal Cannula 2L) Continent Weight: 285 lb (129.3 kg) Height:  5\' 7"  (170.2 cm)  BEHAVIORAL SYMPTOMS/MOOD NEUROLOGICAL BOWEL NUTRITION STATUS      Continent Diet (Regular)  AMBULATORY STATUS COMMUNICATION OF NEEDS Skin   Extensive Assist Verbally Surgical wounds (Incision Left Knee)                       Personal Care Assistance Level of Assistance  Bathing, Feeding, Dressing Bathing Assistance: Limited assistance Feeding assistance: Independent Dressing Assistance: Limited assistance     Functional Limitations Info  Sight, Hearing, Speech Sight Info: Adequate Hearing Info: Adequate Speech Info: Adequate    SPECIAL CARE FACTORS FREQUENCY  PT (By licensed PT), OT (By licensed OT)     PT Frequency:  (5) OT Frequency:  (5)            Contractures      Additional Factors Info  Code Status, Allergies Code Status Info:  (Full Code) Allergies Info:  (HYDROCHLOROTHIAZIDE, PENICILLIN V POTASSIUM, PENICILLINS, LISINOPRIL )           Current Medications (02/01/2017):  This is the current  hospital active medication list Current Facility-Administered Medications  Medication Dose Route Frequency Provider Last Rate Last Dose  . 0.9 %  sodium chloride infusion   Intravenous Continuous 02/03/2017, MD      . acetaminophen (TYLENOL) tablet 650 mg  650 mg Oral Q4H PRN Kennedy Bucker, MD       Or  . acetaminophen (TYLENOL) suppository 650 mg  650 mg Rectal Q4H PRN Kennedy Bucker, MD      . acetaminophen (TYLENOL) tablet 1,000 mg  1,000 mg Oral Q6H Kennedy Bucker, MD      . b complex vitamins tablet 1 tablet  1 tablet Oral Daily Kennedy Bucker, MD      . Biotin 1 mg  1,000 mcg Oral Daily Kennedy Bucker, MD      . bisacodyl (DULCOLAX) suppository 10 mg  10 mg Rectal Daily PRN Kennedy Bucker, MD      . Calcium Carb-Cholecalciferol 600-200 MG-UNIT TABS   Oral QHS Kennedy Bucker, MD      . clindamycin (CLEOCIN) IVPB 900 mg  900 mg Intravenous Q6H Kennedy Bucker, MD      . diphenhydrAMINE (BENADRYL) 12.5 MG/5ML elixir 12.5-25 mg  12.5-25 mg Oral Q4H PRN 05-05-1984, MD      . docusate sodium (COLACE) capsule 100 mg  100 mg Oral BID Kennedy Bucker, MD      . Kennedy Bucker ON 02/02/2017] enoxaparin (LOVENOX) injection 30 mg  30 mg Subcutaneous  Q12H Kennedy Bucker, MD      . gabapentin (NEURONTIN) capsule 200 mg  200 mg Oral TID Kennedy Bucker, MD      . Melene Muller ON 02/02/2017] Influenza vac split quadrivalent PF (FLUARIX) injection 0.5 mL  0.5 mL Intramuscular Tomorrow-1000 Kennedy Bucker, MD      . loratadine (CLARITIN) tablet 10 mg  10 mg Oral Daily Kennedy Bucker, MD      . magnesium hydroxide (MILK OF MAGNESIA) suspension 30 mL  30 mL Oral Daily PRN Kennedy Bucker, MD      . menthol-cetylpyridinium (CEPACOL) lozenge 3 mg  1 lozenge Oral PRN Kennedy Bucker, MD       Or  . phenol (CHLORASEPTIC) mouth spray 1 spray  1 spray Mouth/Throat PRN Kennedy Bucker, MD      . metoCLOPramide (REGLAN) tablet 5-10 mg  5-10 mg Oral Q8H PRN Kennedy Bucker, MD       Or  . metoCLOPramide (REGLAN) injection 5-10 mg  5-10 mg  Intravenous Q8H PRN Kennedy Bucker, MD      . multivitamin with minerals tablet 1 tablet  1 tablet Oral Daily Kennedy Bucker, MD      . ondansetron [facility]) tablet 4 mg  4 mg Oral Q6H PRN Kennedy Bucker, MD       Or  . ondansetron [facility]) injection 4 mg  4 mg Intravenous Q6H PRN Kennedy Bucker, MD      . tiZANidine (ZANAFLEX) tablet 4 mg  4 mg Oral QHS Kennedy Bucker, MD      . tranexamic acid (CYKLOKAPRON) 1,000 mg in sodium chloride 0.9 % 100 mL IVPB  1,000 mg Intravenous To OR Kennedy Bucker, MD      . zolpidem (AMBIEN) tablet 5 mg  5 mg Oral QHS PRN Kennedy Bucker, MD         Discharge Medications: Please see discharge summary for a list of discharge medications.  Relevant Imaging Results:  Relevant Lab Results:   Additional Information  (SSN: [id number])  Payton Spark, Student-Social Work

## 2017-02-01 NOTE — Anesthesia Procedure Notes (Signed)
Date/Time: 02/01/2017 8:10 AM Performed by: Junious Silk Pre-anesthesia Checklist: Patient identified, Emergency Drugs available, Suction available, Patient being monitored and Timeout performed Oxygen Delivery Method: Simple face mask

## 2017-02-01 NOTE — Anesthesia Preprocedure Evaluation (Signed)
Anesthesia Evaluation  Patient identified by MRN, date of birth, ID band Patient awake    Reviewed: Allergy & Precautions, H&P , NPO status , Patient's Chart, lab work & pertinent test results, reviewed documented beta blocker date and time   History of Anesthesia Complications Negative for: history of anesthetic complications  Airway Mallampati: III  TM Distance: >3 FB Neck ROM: full    Dental  (+) Dental Advidsory Given, Missing   Pulmonary neg pulmonary ROS, former smoker,           Cardiovascular Exercise Tolerance: Poor negative cardio ROS       Neuro/Psych negative neurological ROS  negative psych ROS   GI/Hepatic Neg liver ROS, GERD  ,  Endo/Other  neg diabetesMorbid obesity  Renal/GU negative Renal ROS  negative genitourinary   Musculoskeletal   Abdominal   Peds  Hematology negative hematology ROS (+)   Anesthesia Other Findings Past Medical History: No date: Neuropathy     Comment:  legs No date: Rheumatoid arthritis (HCC)   Reproductive/Obstetrics negative OB ROS                             Anesthesia Physical Anesthesia Plan  ASA: III  Anesthesia Plan: General and Spinal   Post-op Pain Management:    Induction:   PONV Risk Score and Plan:   Airway Management Planned:   Additional Equipment:   Intra-op Plan:   Post-operative Plan:   Informed Consent: I have reviewed the patients History and Physical, chart, labs and discussed the procedure including the risks, benefits and alternatives for the proposed anesthesia with the patient or authorized representative who has indicated his/her understanding and acceptance.   Dental Advisory Given  Plan Discussed with: Anesthesiologist, CRNA and Surgeon  Anesthesia Plan Comments:         Anesthesia Quick Evaluation

## 2017-02-01 NOTE — Transfer of Care (Signed)
Immediate Anesthesia Transfer of Care Note  Patient: Ellen Garcia  Procedure(s) Performed: TOTAL KNEE ARTHROPLASTY (Left Knee)  Patient Location: PACU  Anesthesia Type:Spinal  Level of Consciousness: awake, alert  and oriented  Airway & Oxygen Therapy: Patient Spontanous Breathing and Patient connected to face mask oxygen  Post-op Assessment: Report given to RN and Post -op Vital signs reviewed and stable  Post vital signs: Reviewed and stable  Last Vitals:  Vitals:   02/01/17 0609  BP: (!) 180/100  Pulse: 80  Resp: 20  Temp: (!) 36.3 C  SpO2: 98%    Last Pain:  Vitals:   02/01/17 0609  TempSrc: Tympanic  PainSc: 10-Worst pain ever         Complications: No apparent anesthesia complications

## 2017-02-01 NOTE — Op Note (Signed)
02/01/2017  9:47 AM  PATIENT:  Ellen Garcia  61 y.o. female  PRE-OPERATIVE DIAGNOSIS:  PRIMARY OSTEOARTHRITIS OF LEFT KNEE  POST-OPERATIVE DIAGNOSIS:  PRIMARY OSTEOARTHRITIS OF LEFT KNEE  PROCEDURE:  Procedure(s): TOTAL KNEE ARTHROPLASTY (Left)  SURGEON: Leitha Schuller, MD  ASSISTANTS: [facility]  ANESTHESIA:   spinal  EBL:  Total I/O In: 1100 [I.V.:1100] Out: 150 [Urine:100; Blood:50]  BLOOD ADMINISTERED:none  DRAINS: none   LOCAL MEDICATIONS USED:  MARCAINE    and OTHER morphine Toradol and Exparel  SPECIMEN:  No Specimen  DISPOSITION OF SPECIMEN:  N/A  COUNTS:  YES  TOURNIQUET:   Total Tourniquet Time Documented: Thigh (Left) - 67 minutes Total: Thigh (Left) - 67 minutes   IMPLANTS: Medacta GMK left 4 femur, 3 tibia with short stem and 14 mm PS insert, patella size 2, all components cemented  DICTATION: .Dragon Dictation   patient brought the operating room and after adequate spinal anesthesia was obtained left leg was prepped and draped in sterile fashion. After patient identification and timeout procedures were completed tourniquet was raised and midline skin incision was made followed by medial parapatellar arthrotomy. The knee revealed extensive loss of cartilage and bone in the lateral compartment tibia and femur, a large area of the medial femoral condyle which is also deficient and cartilage and exposed bone on femoral trochlea and patella consistent with severe tricompartmental osteoarthritis with valgus deformity. There is also moderate synovitis present.. The fat pad and anterior cruciate ligament and PCL were excised and the proximal tibia cutting guide was applied to the proximal tibia and axial tibia cut carried out followed by distal femur cut. The 4-in-1 cutting guide was applied to the femur anterior posterior and chamfer cuts made. The posterior horns of the menisci could be resected this time and the size 3tibia baseplate trial was placed  in apporpriote rotationand proximal reaming carried out for short stem. The keel punch was placed followed by the 4 femoral trial and a 65mm insert gave good stability through range of motion. This was thenchosen as the final implant. Distal femur drill holes were made followed by the trochlear groove cut and PS reaming and then trials were removed. The patella was affected with arthritis as welland resection was carried out drilling plate carried out and then a size 2 patella fit well. These trials were all removed and tourniquet was let down at this point with hemostasis checked electrocautery. Thelocal anesthetic noted above was infiltrated in the para-articular tissue. The tourniquet was then raised and the bony surfaces thoroughly irrigated and dried. Tibial component was cemented in place first followed by the plastic insert and then the femoral component and the knee placed in extension with excess cement being removed. Next the patellar button was clamped into place and after the cement entirely set the clamp was removed the patella tracked well with the tourniquet down the knee was thoroughly irrigated and then closed with a heavy Quill with 3-0 locking suture followed by skin staples. Incisional wound VAC applied along with a Polar Care   PLAN OF CARE: Admit to inpatient   PATIENT DISPOSITION:  PACU - hemodynamically stable.

## 2017-02-01 NOTE — Evaluation (Signed)
Physical Therapy Evaluation Patient Details Name: Ellen Garcia MRN: [id number] DOB: Aug 01, 1955 Today's Date: 02/01/2017   History of Present Illness  Pt underwent L TKR without reported post-op complications. She is POD#0 at time of PT evaluation. PMH: RA, obesity, and GERD  Clinical Impression  Pt admitted with above diagnosis. Pt currently with functional limitations due to the deficits listed below (see PT Problem List). Pt able to complete supine exercises as instructed by therapist. She is very anxious with increased pain while laying in bed. Pt is very eager to get up and requests transfer to the chair. She requires minA+1 for supine to/from sit. HOB elevated and bed rails utilized. Pt requiring assist for LLE when moving to the EOB and when returning to supine. Once sitting upright at EOB pt becomes diaphoretic and nauseated. She then starts to rock back and forth and won't open eyes or repond clearly to therapist. Pt starts to hyperventilate and states that she needs to lay back down. RN contacted who comes to assist therapist and assess patient. Vitals obtained and they are grossly WNL. Pt returned to supine and further mobility testing deferred. Pt appears agitated and confused after returning to bed. Therapist left pt in care of RN. Will assess transfers and ambulation at next session as able/appropriate. Pt will benefit from PT services to address deficits in strength, balance, and mobility in order to return to full function at home.       Follow Up Recommendations Home health PT    Equipment Recommendations  Rolling walker with 5" wheels    Recommendations for Other Services OT consult     Precautions / Restrictions Precautions Precautions: Knee;Fall Precaution Booklet Issued: Yes (comment) Restrictions Weight Bearing Restrictions: Yes LLE Weight Bearing: Weight bearing as tolerated      Mobility  Bed Mobility Overal bed mobility: Needs Assistance Bed Mobility:  Supine to Sit;Sit to Supine     Supine to sit: Min assist Sit to supine: Min assist   General bed mobility comments: HOB elevated and bed rails utilized. Pt requiring assist for LLE when moving to the EOB and when returning to supine. Once sitting upright at EOB pt becomes diaphoretic and nauseated. She then starts to rock back and forth and won't open eyes or repond clearly to therapist. Pt starts to hyperventilate and states that she needs to lay back down. RN contacted who comes to assist therapist and assess patient. Vitals obtained and they are grossly WNL. Pt returned to supine and further mobility testing deferred. Pt appears agitated and confused after returning to bed.   Transfers                 General transfer comment: Unable to attempt  Ambulation/Gait             General Gait Details: Unable to attempt  Stairs            Wheelchair Mobility    Modified Rankin (Stroke Patients Only)       Balance Overall balance assessment: Needs assistance Sitting-balance support: No upper extremity supported Sitting balance-Leahy Scale: Good                                       Pertinent Vitals/Pain Pain Assessment: 0-10 Pain Score: 9  Pain Location: L knee Pain Intervention(s): Premedicated before session;Repositioned;Monitored during session;Limited activity within patient's tolerance    Home Living Family/patient  expects to be discharged to:: Private residence Living Arrangements: Spouse/significant other Available Help at Discharge: Family Type of Home: House Home Access: Stairs to enter Entrance Stairs-Rails: None Secretary/administrator of Steps: 2 Home Layout: Laundry or work area in basement;Able to live on main level with bedroom/bathroom Home Equipment: Gilmer Mor - single point Additional Comments: No walker, BSC, or shower seat per patient    Prior Function Level of Independence: Independent with assistive device(s)          Comments: Pt reports that she was previously independent for community ambulation with single point cane in RUE. She reports "a couple" falls in the last 12 months. Indendendent with ADLs/IADLs. Drives     Hand Dominance        Extremity/Trunk Assessment   Upper Extremity Assessment Upper Extremity Assessment: Overall WFL for tasks assessed    Lower Extremity Assessment Lower Extremity Assessment: LLE deficits/detail LLE Deficits / Details: Full LLE DF/PF. Reports intact sensation. Requiring assist for L SLR although pt appears to have strength and limited by pain and guarding       Communication   Communication: No difficulties  Cognition Arousal/Alertness: Awake/alert Behavior During Therapy: Restless;Anxious;Impulsive Overall Cognitive Status: Within Functional Limits for tasks assessed                                        General Comments      Exercises Total Joint Exercises Ankle Circles/Pumps: AROM;Both;10 reps;Supine Quad Sets: Strengthening;Both;10 reps;Supine Gluteal Sets: Strengthening;Both;10 reps;Supine Towel Squeeze: Strengthening;Both;10 reps;Supine Short Arc Quad: Strengthening;Left;10 reps;Supine Heel Slides: Strengthening;Left;10 reps;Supine Hip ABduction/ADduction: Strengthening;Left;10 reps;Supine Straight Leg Raises: Strengthening;Left;10 reps;Supine Goniometric ROM: Unable to obtained measurement today due to patients anxiety, pain, and episode of anxiety? at end of session   Assessment/Plan    PT Assessment Patient needs continued PT services  PT Problem List Decreased strength;Decreased range of motion;Decreased activity tolerance;Decreased balance;Decreased mobility;Pain       PT Treatment Interventions DME instruction;Gait training;Stair training;Functional mobility training;Therapeutic activities;Therapeutic exercise;Balance training;Neuromuscular re-education;Patient/family education;Manual techniques    PT Goals  (Current goals can be found in the Care Plan section)  Acute Rehab PT Goals Patient Stated Goal: Return to prior function at home PT Goal Formulation: With patient Time For Goal Achievement: 02/15/17 Potential to Achieve Goals: Good    Frequency BID   Barriers to discharge        Co-evaluation               AM-PAC PT "6 Clicks" Daily Activity  Outcome Measure Difficulty turning over in bed (including adjusting bedclothes, sheets and blankets)?: Unable Difficulty moving from lying on back to sitting on the side of the bed? : Unable Difficulty sitting down on and standing up from a chair with arms (e.g., wheelchair, bedside commode, etc,.)?: Unable Help needed moving to and from a bed to chair (including a wheelchair)?: A Lot Help needed walking in hospital room?: A Lot Help needed climbing 3-5 steps with a railing? : A Lot 6 Click Score: 9    End of Session   Activity Tolerance: Treatment limited secondary to agitation Patient left: in bed;with call bell/phone within reach;with bed alarm set;with family/visitor present;with nursing/sitter in room Nurse Communication: Other (comment) (Anxiety attack? sitting at EOB) PT Visit Diagnosis: Unsteadiness on feet (R26.81);Other abnormalities of gait and mobility (R26.89);Muscle weakness (generalized) (M62.81);Pain Pain - Right/Left: Left Pain - part of body: Knee  Time: 1350-1414 PT Time Calculation (min) (ACUTE ONLY): 24 min   Charges:   PT Evaluation $PT Eval Low Complexity: 1 Low PT Treatments $Therapeutic Exercise: 8-22 mins   PT G Codes:   PT G-Codes **NOT FOR INPATIENT CLASS** Functional Assessment Tool Used: AM-PAC 6 Clicks Basic Mobility Functional Limitation: Mobility: Walking and moving around Mobility: Walking and Moving Around Current Status (G8916): At least 60 percent but less than 80 percent impaired, limited or restricted Mobility: Walking and Moving Around Goal Status [phone]): At least 20 percent but  less than 40 percent impaired, limited or restricted    Lynnea Maizes PT, DPT    Nixxon Faria 02/01/2017, 3:02 PM

## 2017-02-01 NOTE — Care Management (Signed)
CM has attempted to assess patient but physical therapy is evaluating.  Patient had total arthroplasty left knee today.  Will reattempt to assess

## 2017-02-01 NOTE — Progress Notes (Signed)
Anticoagulation monitoring(Lovenox):  61yo  F ordered Lovenox 30 mg D[place] Clap Weights   02/01/17 2952  Weight: 285 lb (129.3 kg)   BMI 44.63   Lab Results  Component Value Date   CREATININE 0.72 02/01/2017   CREATININE 0.68 01/19/2017   Estimated Creatinine Clearance: 103.4 mL/min (by C-G formula based on SCr of 0.72 mg/dL). Hemoglobin & Hematocrit     Component Value Date/Time   HGB 11.2 (L) 02/01/2017 1227   HCT 34.2 (L) 02/01/2017 1227     Per Protocol for Patient with estCrcl > 30 ml/min and BMI > 40, will transition to Lovenox 40 mg Q12h.     Bari Mantis PharmD Clinical Pharmacist 02/01/2017

## 2017-02-01 NOTE — H&P (Signed)
Reviewed paper H+P, will be scanned into chart. No changes noted.  

## 2017-02-01 NOTE — Anesthesia Procedure Notes (Signed)
Spinal  Patient location during procedure: OR Start time: 02/01/2017 7:30 AM End time: 02/01/2017 7:42 AM Staffing Resident/CRNA: Nelda Marseille Performed: resident/CRNA  Preanesthetic Checklist Completed: patient identified, site marked, surgical consent, pre-op evaluation, timeout performed, IV checked, risks and benefits discussed and monitors and equipment checked Spinal Block Patient position: sitting Prep: Betadine Patient monitoring: heart rate, continuous pulse ox, blood pressure and cardiac monitor Approach: midline Location: L3-4 Injection technique: single-shot Needle Needle type: Whitacre and Introducer  Needle gauge: 25 G Needle length: 9 cm Assessment Sensory level: T10 Additional Notes Negative paresthesia. Negative blood return. Positive free-flowing CSF. Expiration date of kit checked and confirmed. Patient tolerated procedure well, without complications.

## 2017-02-02 ENCOUNTER — Encounter: Payer: Self-pay | Admitting: Orthopedic Surgery

## 2017-02-02 LAB — BASIC METABOLIC PANEL
Anion gap: 7 (ref 5–15)
BUN: 16 mg/dL (ref 6–20)
CHLORIDE: 101 mmol/L (ref 101–111)
CO2: 27 mmol/L (ref 22–32)
CREATININE: 0.61 mg/dL (ref 0.44–1.00)
Calcium: 8.6 mg/dL — ABNORMAL LOW (ref 8.9–10.3)
GFR calc Af Amer: >60 mL/min (ref >60)
GFR calc non Af Amer: >60 mL/min (ref >60)
Glucose, Bld: 152 mg/dL — ABNORMAL HIGH (ref 65–99)
POTASSIUM: 4.1 mmol/L (ref 3.5–5.1)
Sodium: 135 mmol/L (ref 135–145)

## 2017-02-02 LAB — CBC
HEMATOCRIT: 31 % — AB (ref 35.0–47.0)
HEMOGLOBIN: 10.4 g/dL — AB (ref 12.0–16.0)
MCH: 29.3 pg (ref 26.0–34.0)
MCHC: 33.4 g/dL (ref 32.0–36.0)
MCV: 87.7 fL (ref 80.0–100.0)
Platelets: 240 10*3/uL (ref 150–440)
RBC: 3.54 MIL/uL — AB (ref 3.80–5.20)
RDW: 13.2 % (ref 11.5–14.5)
WBC: 6.7 10*3/uL (ref 3.6–11.0)

## 2017-02-02 MED ORDER — METOCLOPRAMIDE HCL 5 MG/ML IJ SOLN
10.0000 mg | Freq: Four times a day (QID) | INTRAMUSCULAR | Status: AC
  Administered 2017-02-02 – 2017-02-03 (×4): 10 mg via INTRAVENOUS
  Filled 2017-02-02 (×4): qty 2

## 2017-02-02 MED ORDER — SCOPOLAMINE 1 MG/3DAYS TD PT72
1.0000 | MEDICATED_PATCH | TRANSDERMAL | Status: DC
  Administered 2017-02-02: 1.5 mg via TRANSDERMAL
  Filled 2017-02-02: qty 1

## 2017-02-02 MED ORDER — HYDRALAZINE HCL 20 MG/ML IJ SOLN
10.0000 mg | Freq: Once | INTRAMUSCULAR | Status: AC
  Administered 2017-02-02: 10 mg via INTRAVENOUS
  Filled 2017-02-02: qty 1

## 2017-02-02 NOTE — Clinical Social Work Placement (Signed)
   CLINICAL SOCIAL WORK PLACEMENT  NOTE  Date:  02/02/2017  Patient Details  Name: Ellen Garcia MRN: [id number] Date of Birth: February 21, 1956  Clinical Social Work is seeking post-discharge placement for this patient at the Skilled  Nursing Facility level of care (*CSW will initial, date and re-position this form in  chart as items are completed):  Yes   Patient/family provided with [place] Clinical Social Work Department's list of facilities offering this level of care within the geographic area requested by the patient (or if unable, by the patient's family).  Yes   Patient/family informed of their freedom to choose among providers that offer the needed level of care, that participate in Medicare, Medicaid or managed care program needed by the patient, have an available bed and are willing to accept the patient.  Yes   Patient/family informed of [facility]'s ownership interest in [facility] and [facility], as well as of the fact that they are under no obligation to receive care at these facilities.  PASRR submitted to EDS on 02/01/17     PASRR number received on 02/01/17     Existing PASRR number confirmed on       FL2 transmitted to all facilities in geographic area requested by pt/family on 02/01/17     FL2 transmitted to all facilities within larger geographic area on       Patient informed that his/her managed care company has contracts with or will negotiate with certain facilities, including the following:        Yes   Patient/family informed of bed offers received.  Patient chooses bed at  ([facility] )     Physician recommends and patient chooses bed at      Patient to be transferred to   on  .  Patient to be transferred to facility by       Patient family notified on   of transfer.  Name of family member notified:        PHYSICIAN       Additional Comment:    _______________________________________________ Makaley Storts, Darleen Crocker, LCSW 02/02/2017,  10:59 AM

## 2017-02-02 NOTE — Evaluation (Signed)
Occupational Therapy Evaluation Patient Details Name: Ellen Garcia MRN: [id number] DOB: 1955/10/08 Today's Date: 02/02/2017    History of Present Illness Pt. is a 61 y.o. female who was admiited to [facility] for a Left TKR. Pt. PMHx includes: RA, Obesity, and GERD.   Clinical Impression   Pt. Is a 61 y.o. Female who was admitted to [facility] for a Left TKR. Pt. Presents with 10/10 pain in the left knee, weakness, RA, limited activity tolerance, and limited functional mobility which hinders her ability to complete basic ADL tasks. Pt. resides at home with her husband. Pt. was previously independent with ADLs, IADLs, medication management, meal preparation, driving, and working 2 hours a day. Pt. education was provided about A/E use for LE ADLs. Pt. could benefit from skilled OT services for ADL training, A/E training, and pt. Education about work simplification techniques, joint protection, home modification, and DME. Pt. could benefit from SNF level of care upon discharge. Pt.'s husband is a long distance truck driver, and is planning to go to driving soon.    Follow Up Recommendations  SNF    Equipment Recommendations       Recommendations for Other Services       Precautions / Restrictions Precautions Precautions: Knee;Fall Precaution Booklet Issued: Yes (comment) Restrictions Weight Bearing Restrictions: Yes LLE Weight Bearing: Weight bearing as tolerated             ADL either performed or assessed with clinical judgement   ADL Overall ADL's : Needs assistance/impaired Eating/Feeding: Set up   Grooming: Set up;Sitting   Upper Body Bathing: Minimal assistance   Lower Body Bathing: Maximal assistance   Upper Body Dressing : Minimal assistance   Lower Body Dressing: Maximal assistance               Functional mobility during ADLs:  (Defeered secondary 10/10 pain.) General ADL Comments: Pt. and husband education about A/E for LE ADLs.     Vision          Perception     Praxis      Pertinent Vitals/Pain Pain Assessment: 0-10 Pain Score: 10-Worst pain ever Pain Location: Left Knee Pain Intervention(s): Limited activity within patient's tolerance;Monitored during session. Nursing was notified.     Hand Dominance Right   Extremity/Trunk Assessment Upper Extremity Assessment Upper Extremity Assessment: Overall WFL for tasks assessed (Pt. has difficulty opeing packages.)           Communication Communication Communication: No difficulties   Cognition Arousal/Alertness: Awake/alert Behavior During Therapy: Restless;Anxious Overall Cognitive Status: Within Functional Limits for tasks assessed                                     General Comments       Exercises   Shoulder Instructions      Home Living Family/patient expects to be discharged to:: Private residence Living Arrangements: Spouse/significant other (Husband is a long distance Naval architect.) Available Help at Discharge: Family Type of Home: House Home Access: Stairs to enter Secretary/administrator of Steps: 2 Entrance Stairs-Rails: None Home Layout: Able to live on main level with bedroom/bathroom;One level (WIth a basement. Reports that she does not need to go to the basement.)     Bathroom Shower/Tub: Chief Strategy Officer: Standard     Home Equipment: Cane - single point   Additional Comments: Pt. reports having multiple canes.  Prior Functioning/Environment Level of Independence: Independent with assistive device(s)        Comments: Pt. 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        OT Problem List: Decreased strength;Decreased activity tolerance;Pain      OT Treatment/Interventions: Self-care/ADL training;Therapeutic exercise;Therapeutic activities;DME and/or AE instruction;Patient/family education    OT Goals(Current  goals can be found in the care plan section) Acute Rehab OT Goals Patient Stated Goal: To go to SNF. OT Goal Formulation: With patient Potential to Achieve Goals: Good  OT Frequency: Min 2X/week   Barriers to D/C:            Co-evaluation              AM-PAC PT "6 Clicks" Daily Activity     Outcome Measure Help from another person eating meals?: None Help from another person taking care of personal grooming?: A Little Help from another person toileting, which includes using toliet, bedpan, or urinal?: A Lot Help from another person bathing (including washing, rinsing, drying)?: A Lot Help from another person to put on and taking off regular upper body clothing?: A Little Help from another person to put on and taking off regular lower body clothing?: A Lot 6 Click Score: 16   End of Session    Activity Tolerance: Patient tolerated treatment well Patient left: in chair;with call bell/phone within reach;with chair alarm set  OT Visit Diagnosis: Muscle weakness (generalized) (M62.81)                Time: 2440-1027 OT Time Calculation (min): 21 min Charges:  OT General Charges $OT Visit: 1 Visit OT Evaluation $OT Eval Low Complexity: 1 Low G-Codes: OT G-codes **NOT FOR INPATIENT CLASS** Functional Limitation: Self care Self Care Current Status (O5366): 0 percent impaired, limited or restricted   Olegario Messier, MS, OTR/L  Olegario Messier, MS, OTR/L 02/02/2017, 11:51 AM

## 2017-02-02 NOTE — Progress Notes (Signed)
   Subjective: 1 Day Post-Op Procedure(s) (LRB): TOTAL KNEE ARTHROPLASTY (Left) Patient reports pain as 7 on 0-10 scale.  Patient states pain is not that bad. Patient is well, and has had no acute complaints or problems Denies any CP, SOB, ABD pain. We will continue therapy today.  Plan is to go Home after hospital stay.  Objective: Vital signs in last 24 hours: Temp:  [97.6 F (36.4 C)-98.7 F (37.1 C)] 98.6 F (37 C) (10/31 0529) Pulse Rate:  [55-75] 75 (10/31 0529) Resp:  [10-32] 16 (10/31 0529) BP: (87-161)/(49-114) 127/57 (10/31 0529) SpO2:  [92 %-100 %] 98 % (10/31 0529)  Intake/Output from previous day: 10/30 0701 - 10/31 0700 In: 2400 [P.O.:840; I.V.:1510; IV Piggyback:50] Out: 1465 [Urine:1415; Blood:50] Intake/Output this shift: No intake/output data recorded.   Recent Labs  02/01/17 1227 02/02/17 0512  HGB 11.2* 10.4*    Recent Labs  02/01/17 1227 02/02/17 0512  WBC 8.1 6.7  RBC 3.89 3.54*  HCT 34.2* 31.0*  PLT 281 240    Recent Labs  02/01/17 1227 02/02/17 0512  NA  --  135  K  --  4.1  CL  --  101  CO2  --  27  BUN  --  16  CREATININE 0.72 0.61  GLUCOSE  --  152*  CALCIUM  --  8.6*   No results for input(s): LABPT, INR in the last 72 hours.  EXAM General - Patient is Alert, Appropriate and Oriented Extremity - Neurovascular intact Sensation intact distally Intact pulses distally Dorsiflexion/Plantar flexion intact No cellulitis present Compartment soft Dressing - dressing C/D/I and no drainage.  Wound VAC intact without drainage. Motor Function - intact, moving foot and toes well on exam.   Past Medical History:  Diagnosis Date  . Neuropathy    legs  . Rheumatoid arthritis (HCC)     Assessment/Plan:   1 Day Post-Op Procedure(s) (LRB): TOTAL KNEE ARTHROPLASTY (Left) Active Problems:   Primary localized osteoarthritis of left knee  Estimated body mass index is 44.64 kg/m as calculated from the following:   Height as of  this encounter: 5\' 7"  (1.702 m).   Weight as of this encounter: 129.3 kg (285 lb). Advance diet Up with therapy  Needs bowel movement Recheck labs in the morning Labs and vital signs within normal limits.  Pain controlled. Care management to assist with discharge to home with home health PT  DVT Prophylaxis - Lovenox, Foot Pumps and TED hose Weight-Bearing as tolerated to left leg   T. , PA-C [facility] 02/02/2017, 7:53 AM

## 2017-02-02 NOTE — Progress Notes (Signed)
Assumed care of pt at 1500. Pt using BSC to void, 5mg  of oxy given for pain 10/10 with decrease to 7. Polar care and wound vac in place.

## 2017-02-02 NOTE — Clinical Social Work Note (Signed)
Clinical Social Work Assessment  Patient Details  Name: Ellen Garcia MRN: [id number] Date of Birth: 06/26/1955  Date of referral:  02/02/17               Reason for consult:  Facility Placement                Permission sought to share information with:  Chartered certified accountant granted to share information::  Yes, Verbal Permission Granted  Name::      [place]::   [place]   Relationship::     Contact Information:     Housing/Transportation Living arrangements for the past 2 months:  [place] of Information:  Patient Patient Interpreter Needed:  None Criminal Activity/Legal Involvement Pertinent to Current Situation/Hospitalization:  No - Comment as needed Significant Relationships:  Spouse Lives with:  Spouse Do you feel safe going back to the place where you live?  Yes Need for family participation in patient care:  Yes (Comment)  Care giving concerns:  Patient lives in [place] with her husband Izell [place] who is a Administrator and travels a lot for work.    Social Worker assessment / plan:  Holiday representative (CSW) received SNF consult. PT is recommending SNF. CSW met with patient alone at bedside to discuss D/C plan. Patient was sitting up in the chair at bedside and was alert and oriented X4. CSW introduced self and explained role of CSW department. Patient reported that she lives in [place] alone most of the time because her husband is a Administrator. CSW explained SNF process and that [place] will have to approve SNF. Patient verbalized her understanding and is agreeable to SNF search in [place]. FL2 complete and faxed out.   CSW presented bed offers to patient and she chose [facility]. Per Alger Simons liaison he will start [facility] authorization. CSW will continue to follow and assist as needed.   Employment status:  Retired Forensic scientist:  Managed Care PT Recommendations:  [place] / Referral to community resources:  [place]  Patient/Family's Response to care:  Patient accepted bed offer from [facility].   Patient/Family's Understanding of and Emotional Response to Diagnosis, Current Treatment, and Prognosis:  Patient was very pleasant and thanked CSW for assistance.   Emotional Assessment Appearance:  Appears stated age Attitude/Demeanor/Rapport:    Affect (typically observed):  Accepting, Adaptable, Pleasant Orientation:  Oriented to Self, Oriented to Place, Oriented to  Time, Oriented to Situation Alcohol / Substance use:  Not Applicable Psych involvement (Current and /or in the community):  No (Comment)  Discharge Needs  Concerns to be addressed:  Discharge Planning Concerns Readmission within the last 30 days:  No Current discharge risk:  Dependent with Mobility Barriers to Discharge:  Continued Medical Work up   [employer], Veronia Beets, LCSW 02/02/2017, 11:00 AM

## 2017-02-02 NOTE — Progress Notes (Signed)
Physical Therapy Treatment Patient Details Name: Ellen Garcia MRN: [id number] DOB: 1956/02/20 Today's Date: 02/02/2017    History of Present Illness Pt underwent L TKR without reported post-op complications. She is POD#0 at time of PT evaluation. PMH: RA, obesity, and GERD    PT Comments    Pt is very limited in her mobility due to pain and nausea. She continues to require assist for bed mobility. She is able to transfer today requiring minA+1. She is only able to take short, shuffling steps from bed to recliner. Cues for proper sequencing with rolling walker. Decreased weight shifting to LLE during ambulation. Increase in anxiety and nausea with ambulation. Pt states she is unable to ambulate farther at this time. Discharge recommendation updated to SNF and CSW notified. Pt in agreement. Will continue to progress toward discharging home if pt is able. Pt will benefit from PT services to address deficits in strength, balance, and mobility in order to return to full function at home.   Follow Up Recommendations  SNF     Equipment Recommendations  Rolling walker with 5" wheels    Recommendations for Other Services OT consult     Precautions / Restrictions Precautions Precautions: Knee;Fall Precaution Booklet Issued: Yes (comment) Restrictions Weight Bearing Restrictions: Yes LLE Weight Bearing: Weight bearing as tolerated    Mobility  Bed Mobility Overal bed mobility: Needs Assistance Bed Mobility: Supine to Sit;Sit to Supine     Supine to sit: Min assist Sit to supine: Min assist   General bed mobility comments: HOB elevated and bed rails utilized. Increased time to perform. Pt requiring assist for LLE when moving to the EOB.  Transfers Overall transfer level: Needs assistance Equipment used: Rolling walker (2 wheeled) Transfers: Sit to/from Stand Sit to Stand: Min assist         General transfer comment: Cues for safe hand placement. Pt requires increased  effort and time a well as assist from therapist  Ambulation/Gait Ambulation/Gait assistance: Min guard Ambulation Distance (Feet): 5 Feet Assistive device: Rolling walker (2 wheeled) Gait Pattern/deviations: Decreased step length - right;Decreased step length - left Gait velocity: Decreased Gait velocity interpretation: <1.8 ft/sec, indicative of risk for recurrent falls General Gait Details: Pt only able to take short, shuffling steps from bed to recliner. Cues for proper sequencing with rolling walker. Decreased weight shifting to LLE during ambulation. Increase in anxiety and nausea with ambulation. Pt states she is unable to ambulate farther at this time   Careers information officer    Modified Rankin (Stroke Patients Only)       Balance Overall balance assessment: Needs assistance Sitting-balance support: No upper extremity supported Sitting balance-Leahy Scale: Good     Standing balance support: Bilateral upper extremity supported Standing balance-Leahy Scale: Fair Standing balance comment: Able to maintain standing balance with UE support on rolling walker                            Cognition Arousal/Alertness: Awake/alert Behavior During Therapy: Restless;Anxious Overall Cognitive Status: Within Functional Limits for tasks assessed                                        Exercises Total Joint Exercises Ankle Circles/Pumps: AROM;Both;10 reps;Supine Quad Sets: Strengthening;Both;10 reps;Supine Gluteal Sets: Strengthening;Both;10 reps;Supine Towel Squeeze: Strengthening;Both;10  reps;Supine Short Arc Quad: Strengthening;Left;10 reps;Supine Heel Slides: Strengthening;Left;10 reps;Supine Hip ABduction/ADduction: Strengthening;Left;10 reps;Supine Straight Leg Raises: Strengthening;Left;10 reps;Supine Goniometric ROM: 0-76 degrees AAROM, pain limited in flexion    General Comments        Pertinent Vitals/Pain Pain  Assessment: 0-10 Pain Score: 8  Pain Location: L knee Pain Intervention(s): Premedicated before session;Monitored during session    Home Living                      Prior Function            PT Goals (current goals can now be found in the care plan section) Acute Rehab PT Goals Patient Stated Goal: Return to prior function at home PT Goal Formulation: With patient Time For Goal Achievement: 02/15/17 Potential to Achieve Goals: Good Progress towards PT goals: Progressing toward goals    Frequency    BID      PT Plan Discharge plan needs to be updated    Co-evaluation              AM-PAC PT "6 Clicks" Daily Activity  Outcome Measure  Difficulty turning over in bed (including adjusting bedclothes, sheets and blankets)?: Unable Difficulty moving from lying on back to sitting on the side of the bed? : Unable Difficulty sitting down on and standing up from a chair with arms (e.g., wheelchair, bedside commode, etc,.)?: Unable Help needed moving to and from a bed to chair (including a wheelchair)?: A Lot Help needed walking in hospital room?: A Lot Help needed climbing 3-5 steps with a railing? : A Lot 6 Click Score: 9    End of Session Equipment Utilized During Treatment: Gait belt Activity Tolerance: Treatment limited secondary to medical complications (Comment);Other (comment);Patient limited by pain (nausea) Patient left: with call bell/phone within reach;in chair;with chair alarm set;with SCD's reapplied;Other (comment) (towel roll under heel, polar care in place) Nurse Communication: Other (comment) (Limited ambulation) PT Visit Diagnosis: Unsteadiness on feet (R26.81);Other abnormalities of gait and mobility (R26.89);Muscle weakness (generalized) (M62.81);Pain Pain - Right/Left: Left Pain - part of body: Knee     Time: 1010-1033 PT Time Calculation (min) (ACUTE ONLY): 23 min  Charges:  $Gait Training: 8-22 mins $Therapeutic Exercise: 8-22  mins                    G Codes:       Sharalyn Ink Jobany Montellano PT, DPT     Lycan Davee 02/02/2017, 10:55 AM

## 2017-02-02 NOTE — Progress Notes (Signed)
1-2 moderate assist to [facility]. AXOx4.

## 2017-02-02 NOTE — Progress Notes (Signed)
Physical Therapy Treatment Patient Details Name: Ellen Garcia MRN: [id number] DOB: 23-Aug-1955 Today's Date: 02/02/2017    History of Present Illness Pt. is a 61 y.o. female who was admiited to [facility] for a Left TKR. Pt. PMHx includes: RA, Obesity, and GERD.    PT Comments    Pt demonstrates good progress with therapy this afternoon. She is able to increase her ambulation distance this afternoon with therapy. She is able to complete all supine exercises as instructed. Will continue to progress ambulation and exercises and next session as pt is able. Pt will benefit from PT services to address deficits in strength, balance, and mobility in order to return to full function at home.     Follow Up Recommendations  SNF     Equipment Recommendations  Rolling walker with 5" wheels    Recommendations for Other Services OT consult     Precautions / Restrictions Precautions Precautions: Knee;Fall Precaution Booklet Issued: Yes (comment) Restrictions Weight Bearing Restrictions: Yes LLE Weight Bearing: Weight bearing as tolerated    Mobility  Bed Mobility Overal bed mobility: Needs Assistance Bed Mobility: Supine to Sit;Sit to Supine       Sit to supine: Min assist   General bed mobility comments: HOB elevated and bed rails utilized. Assist for LE when returning to bed. Increased time required. Pt requiring assist for LLE when moving to the EOB.  Transfers Overall transfer level: Needs assistance Equipment used: Rolling walker (2 wheeled) Transfers: Sit to/from Stand Sit to Stand: Min guard         General transfer comment: Pt is able to transfer to/from bedside commode as well as bed with therapist. Increased time but improved stability and independence  Ambulation/Gait Ambulation/Gait assistance: Min guard Ambulation Distance (Feet): 40 Feet Assistive device: Rolling walker (2 wheeled) Gait Pattern/deviations: Decreased step length - right;Decreased step length -  left Gait velocity: Decreased Gait velocity interpretation: <1.8 ft/sec, indicative of risk for recurrent falls General Gait Details: Pt able to ambulate from recliner to door and back to bed. Cues for sequencing with walker and for turns. Pt with some mild grimacing during ambulation. Gait speed is slow but cadence is consistent. One episodes of LE buckling noted with ambulation.   Stairs            Wheelchair Mobility    Modified Rankin (Stroke Patients Only)       Balance Overall balance assessment: Needs assistance Sitting-balance support: No upper extremity supported Sitting balance-Leahy Scale: Good     Standing balance support: Bilateral upper extremity supported Standing balance-Leahy Scale: Fair Standing balance comment: Able to maintain standing balance with UE support on rolling walker                            Cognition Arousal/Alertness: Awake/alert Behavior During Therapy: Restless;Anxious Overall Cognitive Status: Within Functional Limits for tasks assessed                                        Exercises Total Joint Exercises Ankle Circles/Pumps: AROM;Both;10 reps;Supine Quad Sets: Strengthening;Both;10 reps;Supine Gluteal Sets: Strengthening;Both;10 reps;Supine Towel Squeeze: Strengthening;Both;10 reps;Supine Short Arc Quad: Strengthening;Left;10 reps;Supine Heel Slides: Strengthening;Left;10 reps;Supine Hip ABduction/ADduction: Strengthening;Left;10 reps;Supine Straight Leg Raises: Strengthening;Left;10 reps;Supine    General Comments        Pertinent Vitals/Pain Pain Assessment: Faces Pain Score: 10-Worst pain ever Faces  Pain Scale: Hurts little more Pain Location: Left Knee with ambulation Pain Intervention(s): Monitored during session;Premedicated before session    Home Living Family/patient expects to be discharged to:: Private residence Living Arrangements: Spouse/significant other (Husband is a long  distance [employer] architect.) Available Help at Discharge: Family Type of Home: House Home Access: Stairs to enter Entrance Stairs-Rails: None Home Layout: Able to live on main level with bedroom/bathroom;One level (WIth a basement. Reports that she does not need to go to the basement.) Home Equipment: Cane - single point Additional Comments: Pt. reports having multiple canes.    Prior Function Level of Independence: Independent with assistive device(s)      Comments: Pt. 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   PT Goals (current goals can now be found in the care plan section) Acute Rehab PT Goals Patient Stated Goal: To go to SNF. PT Goal Formulation: With patient Time For Goal Achievement: 02/15/17 Potential to Achieve Goals: Good Progress towards PT goals: Progressing toward goals    Frequency    BID      PT Plan Current plan remains appropriate    Co-evaluation              AM-PAC PT "6 Clicks" Daily Activity  Outcome Measure  Difficulty turning over in bed (including adjusting bedclothes, sheets and blankets)?: Unable Difficulty moving from lying on back to sitting on the side of the bed? : Unable Difficulty sitting down on and standing up from a chair with arms (e.g., wheelchair, bedside commode, etc,.)?: A Lot Help needed moving to and from a bed to chair (including a wheelchair)?: A Little Help needed walking in hospital room?: A Little Help needed climbing 3-5 steps with a railing? : A Lot 6 Click Score: 12    End of Session Equipment Utilized During Treatment: Gait belt Activity Tolerance: Patient tolerated treatment well (nausea) Patient left: with call bell/phone within reach;with SCD's reapplied;Other (comment);in bed;with bed alarm set (towel roll under heel, polar care in place) Nurse Communication: Other (comment) (Limited ambulation) PT Visit Diagnosis:  Unsteadiness on feet (R26.81);Other abnormalities of gait and mobility (R26.89);Muscle weakness (generalized) (M62.81);Pain Pain - Right/Left: Left Pain - part of body: Knee     Time: 0814-4818 PT Time Calculation (min) (ACUTE ONLY): 28 min  Charges:  $Gait Training: 8-22 mins $Therapeutic Exercise: 8-22 mins                    G Codes:      Sharalyn Ink Taniaya Rudder PT, DPT     Osinachi Navarrette 02/02/2017, 3:01 PM

## 2017-02-02 NOTE — Progress Notes (Signed)
Notified Dr Ernest Pine of pt being hypertensive. Received order for hydralazine 10 mg IV once.

## 2017-02-03 ENCOUNTER — Encounter: Payer: Self-pay | Admitting: Orthopedic Surgery

## 2017-02-03 LAB — BASIC METABOLIC PANEL
Anion gap: 5 (ref 5–15)
BUN: 11 mg/dL (ref 6–20)
CALCIUM: 8.6 mg/dL — AB (ref 8.9–10.3)
CHLORIDE: 101 mmol/L (ref 101–111)
CO2: 29 mmol/L (ref 22–32)
CREATININE: 0.84 mg/dL (ref 0.44–1.00)
GFR calc non Af Amer: >60 mL/min (ref >60)
Glucose, Bld: 161 mg/dL — ABNORMAL HIGH (ref 65–99)
Potassium: 3.7 mmol/L (ref 3.5–5.1)
SODIUM: 135 mmol/L (ref 135–145)

## 2017-02-03 LAB — CBC
HCT: 27.4 % — ABNORMAL LOW (ref 35.0–47.0)
HEMOGLOBIN: 9.5 g/dL — AB (ref 12.0–16.0)
MCH: 30.2 pg (ref 26.0–34.0)
MCHC: 34.7 g/dL (ref 32.0–36.0)
MCV: 86.9 fL (ref 80.0–100.0)
PLATELETS: 217 10*3/uL (ref 150–440)
RBC: 3.16 MIL/uL — ABNORMAL LOW (ref 3.80–5.20)
RDW: 13.1 % (ref 11.5–14.5)
WBC: 8.1 10*3/uL (ref 3.6–11.0)

## 2017-02-03 MED ORDER — OXYCODONE HCL 5 MG PO TABS: 5.0000 mg | ORAL_TABLET | ORAL | 0 refills | Status: DC | PRN

## 2017-02-03 MED ORDER — ENOXAPARIN SODIUM 40 MG/0.4ML ~~LOC~~ SOLN: 40.0000 mg | SUBCUTANEOUS | 0 refills | Status: DC

## 2017-02-03 MED ORDER — FE FUMARATE-B12-VIT C-FA-IFC PO CAPS
1.0000 | ORAL_CAPSULE | Freq: Two times a day (BID) | ORAL | Status: DC
  Filled 2017-02-03: qty 1

## 2017-02-03 MED ORDER — DOCUSATE SODIUM 100 MG PO CAPS: 100.0000 mg | ORAL_CAPSULE | Freq: Two times a day (BID) | ORAL | 0 refills | Status: DC

## 2017-02-03 MED ORDER — FE FUMARATE-B12-VIT C-FA-IFC PO CAPS: 1.0000 | ORAL_CAPSULE | Freq: Two times a day (BID) | ORAL | 0 refills | Status: DC

## 2017-02-03 MED ORDER — FLEET ENEMA 7-19 GM/118ML RE ENEM
1.0000 | ENEMA | Freq: Once | RECTAL | Status: AC
  Administered 2017-02-03: 1 via RECTAL

## 2017-02-03 MED ORDER — SCOPOLAMINE 1 MG/3DAYS TD PT72: 1.0000 | MEDICATED_PATCH | TRANSDERMAL | 12 refills | Status: DC

## 2017-02-03 NOTE — Progress Notes (Signed)
OT Cancellation Note  Patient Details Name: Ellen Garcia MRN: [id number] DOB: 1956-02-04   Cancelled Treatment:    Reason Eval/Treat Not Completed: Other (comment). Pt preparing for discharge to [facility].   Richrd Prime, MPH, MS, OTR/L ascom [phone] 02/03/17, 4:35 PM

## 2017-02-03 NOTE — Progress Notes (Signed)
EMS called for transport.

## 2017-02-03 NOTE — Discharge Summary (Addendum)
Physician Discharge Summary  Patient ID: Ellen Garcia MRN: [id number] DOB/AGE: 06/10/1955 61 y.o.  Admit date: 02/01/2017 Discharge date: 02/03/2017  Admission Diagnoses:  PRIMARY OSTEOARTHRITIS OF LEFT KNEE   Discharge Diagnoses: Patient Active Problem List   Diagnosis Date Noted  . Primary localized osteoarthritis of left knee 02/01/2017    Past Medical History:  Diagnosis Date  . Neuropathy    legs  . Rheumatoid arthritis (HCC)      Transfusion: none   Consultants (if any):   Discharged Condition: Improved  Hospital Course: Ellen Garcia is an 61 y.o. female who was admitted 02/01/2017 with a diagnosis of left knee osteoarthritis and went to the operating room on 02/01/2017 and underwent the above named procedures.    Surgeries: Procedure(s): TOTAL KNEE ARTHROPLASTY on 02/01/2017 Patient tolerated the surgery well. Taken to PACU where she was stabilized and then transferred to the orthopedic floor.  Started on Lovenox 40 q 12 hrs. Foot pumps applied bilaterally at 80 mm. Heels elevated on bed with rolled towels. No evidence of DVT. Negative Homan. Physical therapy started on day #1 for gait training and transfer. OT started day #1 for ADL and assisted devices.  Patient's foley was d/c on day #1. Patient's IV was d/c on day #2.  On post op day #2 patient was stable and ready for discharge to SNF.  Implants: Medacta GMK left 4 femur, 3 tibia with short stem and 14 mm PS insert, patella size 2, all components cemented  She was given perioperative antibiotics:  Anti-infectives    Start     Dose/Rate Route Frequency Ordered Stop   02/01/17 1400  clindamycin (CLEOCIN) IVPB 900 mg     900 mg 100 mL/hr over 30 Minutes Intravenous Every 6 hours 02/01/17 1148 02/02/17 0150   02/01/17 0557  clindamycin (CLEOCIN) 900 MG/50ML IVPB    Comments:  Letta Pate   : cabinet override      02/01/17 0557 02/01/17 0745   01/31/17 2300  clindamycin (CLEOCIN) IVPB 900 mg      900 mg 100 mL/hr over 30 Minutes Intravenous  Once 01/31/17 2256 02/01/17 0755    .  She was given sequential compression devices, early ambulation, and lovenox for DVT prophylaxis.  She benefited maximally from the hospital stay and there were no complications.     Remove wound VAC on 02/10/2017 and apply honeycomb dressing. Wear TED hose daily, remove at nighttime for 6 weeks Follow-up with orthopedic clinic in 2 weeks for staple removal and Steri-Strip application.   Recent vital signs:  Vitals:   02/03/17 0128 02/03/17 0949  BP: (!) 119/30 (!) 144/50  Pulse:  86  Resp:    Temp:    SpO2:  96%    Recent laboratory studies:  Lab Results  Component Value Date   HGB 9.5 (L) 02/03/2017   HGB 10.4 (L) 02/02/2017   HGB 11.2 (L) 02/01/2017   Lab Results  Component Value Date   WBC 8.1 02/03/2017   PLT 217 02/03/2017   Lab Results  Component Value Date   INR 0.94 01/19/2017   Lab Results  Component Value Date   NA 135 02/03/2017   K 3.7 02/03/2017   CL 101 02/03/2017   CO2 29 02/03/2017   BUN 11 02/03/2017   CREATININE 0.84 02/03/2017   GLUCOSE 161 (H) 02/03/2017    Discharge Medications:   Allergies as of 02/03/2017      Reactions   Hydrochlorothiazide Other (See Comments)  Caused hair loss   Penicillin V Potassium Hives   As a child   Penicillins Hives   Has patient had a PCN reaction causing immediate rash, facial/tongue/throat swelling, SOB or lightheadedness with hypotension: Yes Has patient had a PCN reaction causing severe rash involving mucus membranes or skin necrosis: Yes Has patient had a PCN reaction that required hospitalization: No Has patient had a PCN reaction occurring within the last 10 years: No If all of the above answers are "NO", then may proceed with Cephalosporin use.   Lisinopril Rash   Caused rash on hands      Medication List    STOP taking these medications   HYDROcodone-acetaminophen 5-325 MG tablet Commonly known as:   NORCO/VICODIN   ibuprofen 800 MG tablet Commonly known as:  ADVIL,MOTRIN   ketorolac 10 MG tablet Commonly known as:  TORADOL     TAKE these medications   acetaminophen 500 MG tablet Commonly known as:  TYLENOL Take 500 mg by mouth every 6 (six) hours as needed.   ALLEGRA PO Take 1 tablet by mouth daily.   b complex vitamins tablet Take 1 tablet by mouth daily.   Biotin 1000 MCG tablet Take 1,000 mcg by mouth daily.   CALCIUM 600 + D PO Take 1 tablet by mouth at bedtime.   docusate sodium 100 MG capsule Commonly known as:  COLACE Take 1 capsule (100 mg total) by mouth 2 (two) times daily.   enoxaparin 40 MG/0.4ML injection Commonly known as:  LOVENOX Inject 0.4 mLs (40 mg total) into the skin daily.   ferrous fumarate-b12-vitamic C-folic acid capsule Commonly known as:  TRINSICON / FOLTRIN Take 1 capsule by mouth 2 (two) times daily.   gabapentin 100 MG capsule Commonly known as:  NEURONTIN Take 200 mg by mouth 3 (three) times daily.   Green Tea 315 MG Caps Take 315 mg by mouth 2 (two) times daily.   multivitamin with minerals Tabs tablet Take 1 tablet by mouth daily.   oxyCODONE 5 MG immediate release tablet Commonly known as:  Oxy IR/ROXICODONE Take 1-2 tablets (5-10 mg total) by mouth every 4 (four) hours as needed for moderate pain or severe pain.   scopolamine 1 MG/3DAYS Commonly known as:  TRANSDERM-SCOP Place 1 patch (1.5 mg total) onto the skin every 3 (three) days.   tiZANidine 4 MG tablet Commonly known as:  ZANAFLEX TAKES 2 TABLETS AT BEDTIME            Durable Medical Equipment        Start     Ordered   02/01/17 1149  DME Walker rolling  Once    Question:  Patient needs a walker to treat with the following condition  Answer:  Status post total knee replacement, left   02/01/17 1148   02/01/17 1149  DME 3 n 1  Once     02/01/17 1148   02/01/17 1149  DME Bedside commode  Once    Question:  Patient needs a bedside commode to treat  with the following condition  Answer:  Status post total left knee replacement   02/01/17 1148      Diagnostic Studies: Dg Knee 1-2 Views Left  Result Date: 02/01/2017 CLINICAL DATA:  Status post left knee replacement. EXAM: LEFT KNEE - 1-2 VIEW COMPARISON:  Radiographs of July 09, 2016. FINDINGS: The femoral and tibial components appear to be well situated. No fracture or dislocation is noted. Expected postoperative changes are noted in the soft tissues  anteriorly. IMPRESSION: Status post left total knee arthroplasty. Electronically Signed   By: Lupita Raider, M.D.   On: 02/01/2017 10:36   Ct Knee Left Wo Contrast  Result Date: 01/06/2017 CLINICAL DATA:  Primary osteoarthritis of the knee. EXAM: CT OF THE LEFT KNEE WITHOUT CONTRAST TECHNIQUE: Multidetector CT imaging of the left knee was performed according to the standard protocol. Multiplanar CT image reconstructions were also generated. COMPARISON:  Left knee x-rays dated July 09, 2016. FINDINGS: Bones/Joint/Cartilage No acute fracture or malalignment. Tricompartmental degenerative changes with marginal osteophytes and joint space narrowing, moderate in the lateral and patellofemoral compartments. There is prominent subchondral cystic change in the lateral compartment. Large intra-articular loose bodies measuring up to 15 mm are noted in the anterior intercondylar notch and anterior lateral joint space. Mild depression of the central lateral tibial plateau. Large joint effusion. Mild joint space narrowing of the left hip. The left ankle is grossly unremarkable. Ligaments Suboptimally assessed by CT. Muscles and Tendons No focal abnormality.  The extensor mechanism is intact. Soft tissues Unremarkable. IMPRESSION: 1. Tricompartmental degenerative changes of the left knee, most severe in the lateral compartment. Multiple large intra-articular loose bodies and large joint effusion. 2. Mild depression of the central lateral tibial plateau, which could  be degenerative in etiology, although a subchondral insufficiency fracture could have a similar appearance. Electronically Signed   By: Obie Dredge M.D.   On: 01/06/2017 15:53    Disposition: 01-Home or Self Care     Contact information for follow-up providers    Evon Slack, PA-C On 02/16/2017.   Specialties:  Orthopedic Surgery, Emergency Medicine Why:  @ 9:15 am Contact information: [address] [place] [address] [phone]            Contact information for after-discharge care    Destination    [facility] Follow up.   Specialty:  Skilled Nursing Facility Contact information: [address] [place] [address] [phone]                   Signed: Patience Musca 02/03/2017, 4:41 PM

## 2017-02-03 NOTE — Progress Notes (Signed)
Physical Therapy Treatment Patient Details Name: Ellen Garcia MRN: [id number] DOB: 1955/04/13 Today's Date: 02/03/2017    History of Present Illness Pt admitted to hospital s/p L TKA.  PMH includes RA, obesity and GERD.    PT Comments    Pt is a 61 year old female admitted to the hospital following a L knee TKA.  She presents with a high pain level this morning due to being unable to receive pain medication related to Htn, as stated by pt's RN.  Pt demonstrated improvement in bed mobility and states that she has been trying to do her bed exercises to the best of her ability.  She requires min assist for STS transfer with use of RW and close supervision for transfer to toilet and chair.  Pt maintains a slow gait speed and requires VC's for upright posture and maintaining RW in close proximity to body.  Pt repeated throughout treatment that she felt dizzy and needed to sit down and that the pain was too high for her to walk too much today.  Pt states that she is comfortable using a RW but that she isn't able to walk as far today due to pain.  PT educated pt concerning importance of early mobility and the healing process.  Pt L knee ext/flex measured -5-80 degrees.  Pt will continue to benefit from skilled PT to improve mobility, tolerance to activity and pain management.   Follow Up Recommendations  SNF     Equipment Recommendations       Recommendations for Other Services       Precautions / Restrictions Precautions Precautions: Fall;Knee Restrictions Weight Bearing Restrictions: Yes LLE Weight Bearing: Weight bearing as tolerated    Mobility  Bed Mobility Overal bed mobility: Modified Independent Bed Mobility: Rolling;Sidelying to Sit;Supine to Sit Rolling: Modified independent (Device/Increase time) Sidelying to sit: Modified independent (Device/Increase time) Supine to sit: Modified independent (Device/Increase time) Sit to supine: Modified independent (Device/Increase  time)   General bed mobility comments: Pt requires use of bed rail and B UE to perform rolling, supine to sit and sidelying to sit.  She also requires unilateral UE assist to move L LE when pivoting to sit on EOB.  Transfers Overall transfer level: Needs assistance Equipment used: Rolling walker (2 wheeled) Transfers: Sit to/from [employer] Transfers   Stand pivot transfers: Supervision       General transfer comment: Pt required min assist for STS due to high pain level today.  She was able to perform standing transfer to toilet to R side with supervision and min VC's for sequencing and reassurance.  Ambulation/Gait Ambulation/Gait assistance: Supervision Ambulation Distance (Feet): 20 Feet Assistive device: Rolling walker (2 wheeled) Gait Pattern/deviations: Step-to pattern;Decreased step length - right;Decreased stance time - left;Trunk flexed   Gait velocity interpretation: Below normal speed for age/gender General Gait Details: Pt's trunk remains in a flexed position as pt is hyperaware of pain today.  She maintains a slow gait speed but is able to manage RW.  Pt required one VC to keep RW close to body.   Stairs            Wheelchair Mobility    Modified Rankin (Stroke Patients Only)       Balance Overall balance assessment: Modified Independent Sitting-balance support: Bilateral upper extremity supported  Cognition Arousal/Alertness: Awake/alert Behavior During Therapy: Restless Overall Cognitive Status: Within Functional Limits for tasks assessed                                 General Comments: Pt was A&O to name and location.  Pt is hyperaware of pain.      Exercises Total Joint Exercises Ankle Circles/Pumps: AROM;20 reps;Both;Supine Quad Sets: Strengthening;Left;10 reps;Supine Heel Slides: AROM;Left;5 reps;Seated Long Arc Quad: AROM;Left;5 reps;Seated Knee Flexion:  AROM;Left;10 reps;Seated Goniometric ROM: Knee ext/flex: L: -5-80 deg    General Comments General comments (skin integrity, edema, etc.): Pt requires use of RW for balance and PWB at this time.  Pt posture remains in a flexed position which is indicative of fall risk as well.      Pertinent Vitals/Pain Pain Assessment: 0-10 Pain Score: 10-Worst pain ever Pain Location: L knee Pain Intervention(s): Limited activity within patient's tolerance;Monitored during session (PT checked with nurse following pt request for pain medication and RN stated that pt cannot receive pain medication at this time due to [facility].)    Home Living                      Prior Function            PT Goals (current goals can now be found in the care plan section) Acute Rehab PT Goals Patient Stated Goal: To start walking for exercise. PT Goal Formulation: With patient Time For Goal Achievement: 02/17/17 Potential to Achieve Goals: Good Progress towards PT goals: Progressing toward goals    Frequency    BID      PT Plan Current plan remains appropriate    Co-evaluation              AM-PAC PT "6 Clicks" Daily Activity  Outcome Measure  Difficulty turning over in bed (including adjusting bedclothes, sheets and blankets)?: A Lot Difficulty moving from lying on back to sitting on the side of the bed? : A Lot Difficulty sitting down on and standing up from a chair with arms (e.g., wheelchair, bedside commode, etc,.)?: A Lot Help needed moving to and from a bed to chair (including a wheelchair)?: A Little Help needed walking in hospital room?: A Lot Help needed climbing 3-5 steps with a railing? : Total 6 Click Score: 12    End of Session Equipment Utilized During Treatment: Gait belt Activity Tolerance: Patient limited by pain Patient left: in chair;with call bell/phone within reach;with chair alarm set;with nursing/sitter in room Nurse Communication: Patient requests pain  meds;Mobility status PT Visit Diagnosis: Unsteadiness on feet (R26.81);Other abnormalities of gait and mobility (R26.89);Muscle weakness (generalized) (M62.81);Pain Pain - Right/Left: Left Pain - part of body: Knee     Time: 4403-4742 PT Time Calculation (min) (ACUTE ONLY): 30 min  Charges:  $Gait Training: 8-22 mins $Therapeutic Exercise: 8-22 mins                    G Codes:  Functional Assessment Tool Used: AM-PAC 6 Clicks Basic Mobility   Glenetta Hew, PT, DPT   Glenetta Hew 02/03/2017, 10:09 AM

## 2017-02-03 NOTE — Anesthesia Postprocedure Evaluation (Signed)
Anesthesia Post Note  Patient: Ellen Garcia  Procedure(s) Performed: TOTAL KNEE ARTHROPLASTY (Left Knee)  Patient location during evaluation: PACU Anesthesia Type: Spinal Level of consciousness: oriented and awake and alert Pain management: pain level controlled Vital Signs Assessment: post-procedure vital signs reviewed and stable Respiratory status: spontaneous breathing and respiratory function stable Cardiovascular status: blood pressure returned to baseline and stable Postop Assessment: no headache, no backache and no apparent nausea or vomiting Anesthetic complications: no     Last Vitals:  Vitals:   02/03/17 0128 02/03/17 0949  BP: (!) 119/30 (!) 144/50  Pulse:  86  Resp:    Temp:    SpO2:  96%    Last Pain:  Vitals:   02/03/17 1139  TempSrc:   PainSc: Asleep                 Lenard Simmer

## 2017-02-03 NOTE — Progress Notes (Signed)
Report called to [place], LPN at [facility]. Answered all questions.

## 2017-02-03 NOTE — Clinical Social Work Placement (Signed)
   CLINICAL SOCIAL WORK PLACEMENT  NOTE  Date:  02/03/2017  Patient Details  Name: Ellen Garcia MRN: [id number] Date of Birth: 12-28-1955  Clinical Social Work is seeking post-discharge placement for this patient at the Skilled  Nursing Facility level of care (*CSW will initial, date and re-position this form in  chart as items are completed):  Yes   Patient/family provided with [facility] Clinical Social Work Department's list of facilities offering this level of care within the geographic area requested by the patient (or if unable, by the patient's family).  Yes   Patient/family informed of their freedom to choose among providers that offer the needed level of care, that participate in Medicare, Medicaid or managed care program needed by the patient, have an available bed and are willing to accept the patient.  Yes   Patient/family informed of [facility]'s ownership interest in [facility] and [facility], as well as of the fact that they are under no obligation to receive care at these facilities.  PASRR submitted to EDS on 02/01/17     PASRR number received on 02/01/17     Existing PASRR number confirmed on       FL2 transmitted to all facilities in geographic area requested by pt/family on 02/01/17     FL2 transmitted to all facilities within larger geographic area on       Patient informed that his/her managed care company has contracts with or will negotiate with certain facilities, including the following:        Yes   Patient/family informed of bed offers received.  Patient chooses bed at  ([facility] )     Physician recommends and patient chooses bed at      Patient to be transferred to  ([facility] ) on 02/03/17.  Patient to be transferred to facility by  [facility] )     Patient family notified on 02/03/17 of transfer.  Name of family member notified:   (CSW attempted to contact patient's husband [name] however he did not answer and his voicemail was  full. )     PHYSICIAN       Additional Comment:    _______________________________________________ Presli Fanguy, Darleen Crocker, LCSW 02/03/2017, 1:33 PM

## 2017-02-03 NOTE — Discharge Instructions (Signed)

## 2017-02-03 NOTE — Progress Notes (Signed)
Patient is medically stable for D/C to [facility] today. Per Jomarie Longs [facility] liaison [facility] authorization has been received. Per Jomarie Longs patient can come today to room 804. RN will call report to RN Konrad Dolores at [phone] and arrange EMS for transport. Clinical Child psychotherapist (CSW) sent D/C orders to [facility] via [facility]. Patient is aware of above. CSW attempted to contact patient's husband [name] however he did not answer and his voicemail was full. Please reconsult if future social work needs arise. CSW signing off.   [employer], LCSW [phone]

## 2017-02-03 NOTE — Progress Notes (Signed)
   Subjective: 2 Days Post-Op Procedure(s) (LRB): TOTAL KNEE ARTHROPLASTY (Left) Patient reports pain as 8 on 0-10 scale.   Patient is well, and has had no acute complaints or problems. HTN last night improved with hydralazine. Denies any CP, SOB, ABD pain. We will continue therapy today.  Plan is to go Home after hospital stay.  Objective: Vital signs in last 24 hours: Temp:  [97.3 F (36.3 C)-100.2 F (37.9 C)] 100.2 F (37.9 C) (10/31 2254) Pulse Rate:  [79-91] 91 (10/31 2254) Resp:  [15-18] 18 (10/31 2254) BP: (119-189)/(30-107) 119/30 (11/01 0128) SpO2:  [92 %-97 %] 92 % (10/31 2254)  Intake/Output from previous day: 10/31 0701 - 11/01 0700 In: 0  Out: 1100 [Urine:1100] Intake/Output this shift: No intake/output data recorded.   Recent Labs  02/01/17 1227 02/02/17 0512 02/03/17 0312  HGB 11.2* 10.4* 9.5*    Recent Labs  02/02/17 0512 02/03/17 0312  WBC 6.7 8.1  RBC 3.54* 3.16*  HCT 31.0* 27.4*  PLT 240 217    Recent Labs  02/02/17 0512 02/03/17 0312  NA 135 135  K 4.1 3.7  CL 101 101  CO2 27 29  BUN 16 11  CREATININE 0.61 0.84  GLUCOSE 152* 161*  CALCIUM 8.6* 8.6*   No results for input(s): LABPT, INR in the last 72 hours.  EXAM General - Patient is Alert, Appropriate and Oriented Extremity - Neurovascular intact Sensation intact distally Intact pulses distally Dorsiflexion/Plantar flexion intact No cellulitis present Compartment soft Dressing - dressing C/D/I and no drainage.  Wound VAC intact without drainage. Motor Function - intact, moving foot and toes well on exam.   Past Medical History:  Diagnosis Date  . Neuropathy    legs  . Rheumatoid arthritis (HCC)     Assessment/Plan:   2 Days Post-Op Procedure(s) (LRB): TOTAL KNEE ARTHROPLASTY (Left) Active Problems:   Primary localized osteoarthritis of left knee  Acute post op blood loss anemia   Estimated body mass index is 44.64 kg/m as calculated from the following:  Height as of this encounter: 5\' 7"  (1.702 m).   Weight as of this encounter: 129.3 kg (285 lb). Advance diet Up with therapy  Needs bowel movement Acute post op blood loss anemia - start Iron supplement.  Care management to assist with discharge to SNF. Possible dc today pending BM  DVT Prophylaxis - Lovenox, Foot Pumps and TED hose Weight-Bearing as tolerated to left leg   T. , PA-C [facility] 02/03/2017, 8:09 AM

## 2017-02-03 NOTE — Care Management (Signed)
[facility] authorization pending. [facility] notified.

## 2017-02-22 DIAGNOSIS — D649 Anemia, unspecified: Secondary | ICD-10-CM | POA: Insufficient documentation

## 2017-04-25 DIAGNOSIS — E119 Type 2 diabetes mellitus without complications: Secondary | ICD-10-CM | POA: Insufficient documentation

## 2017-09-07 ENCOUNTER — Encounter: Payer: Self-pay | Admitting: *Deleted

## 2017-09-07 ENCOUNTER — Other Ambulatory Visit: Payer: Self-pay | Admitting: Podiatry

## 2017-09-07 ENCOUNTER — Encounter
Admission: RE | Admit: 2017-09-07 | Discharge: 2017-09-07 | Disposition: A | Discharge status: Home / Self Care | Payer: BLUE CROSS/BLUE SHIELD | Source: Ambulatory Visit | Attending: Podiatry | Admitting: Podiatry

## 2017-09-07 ENCOUNTER — Other Ambulatory Visit: Payer: Self-pay

## 2017-09-07 HISTORY — DX: Other fracture of unspecified lower leg, initial encounter for closed fracture: S82.899A

## 2017-09-07 HISTORY — DX: Prediabetes: R73.03

## 2017-09-07 HISTORY — DX: Essential (primary) hypertension: I10

## 2017-09-07 NOTE — Patient Instructions (Signed)
Your procedure is scheduled on: 09/09/17 Fri Report to Same Day Surgery 2nd floor medical mall [facility] Entrance-take elevator on left to 2nd floor.  Check in with surgery information desk.) To find out your arrival time please call [phone] between 1PM - 3PM on 09/08/17 Thurs  Remember: Instructions that are not followed completely may result in serious medical risk, up to and including death, or upon the discretion of your surgeon and anesthesiologist your surgery may need to be rescheduled.    _x___ 1. Do not eat food after midnight the night before your procedure. You may drink clear liquids up to 2 hours before you are scheduled to arrive at the hospital for your procedure.  Do not drink clear liquids within 2 hours of your scheduled arrival to the hospital.  Clear liquids include  --Water or Apple juice without pulp  --Clear carbohydrate beverage such as ClearFast or Gatorade  --Black Coffee or Clear Tea (No milk, no creamers, do not add anything to                  the coffee or Tea Type 1 and type 2 diabetics should only drink water.  No gum chewing or hard candies.     __x__ 2. No Alcohol for 24 hours before or after surgery.   __x__3. No Smoking or e-cigarettes for 24 prior to surgery.  Do not use any chewable tobacco products for at least 6 hour prior to surgery   ____  4. Bring all medications with you on the day of surgery if instructed.    __x__ 5. Notify your doctor if there is any change in your medical condition     (cold, fever, infections).    x___6. On the morning of surgery brush your teeth with toothpaste and water.  You may rinse your mouth with mouth wash if you wish.  Do not swallow any toothpaste or mouthwash.   Do not wear jewelry, make-up, hairpins, clips or nail polish.  Do not wear lotions, powders, or perfumes. You may wear deodorant.  Do not shave 48 hours prior to surgery. Men may shave face and neck.  Do not bring valuables to the hospital.     [facility] is not responsible for any belongings or valuables.               Contacts, dentures or bridgework may not be worn into surgery.  Leave your suitcase in the car. After surgery it may be brought to your room.  For patients admitted to the hospital, discharge time is determined by your                       treatment team.  _  Patients discharged the day of surgery will not be allowed to drive home.  You will need someone to drive you home and stay with you the night of your procedure.    Please read over the following fact sheets that you were given:   [facility] Preparing for Surgery and or MRSA Information   _x___ Take anti-hypertensive listed below, cardiac, seizure, asthma,     anti-reflux and psychiatric medicines. These include:  1. gabapentin (NEURONTIN) 200 MG capsule  2.HYDROcodone-acetaminophen (NORCO/VICODIN) 5-325 MG tablet if needed  3.  4.  5.  6.  ____Fleets enema or Magnesium Citrate as directed.   _x___ Use CHG Soap or sage wipes as directed on instruction sheet   ____ Use inhalers on the day  of surgery and bring to hospital day of surgery  ____ Stop Metformin and Janumet 2 days prior to surgery.    ____ Take 1/2 of usual insulin dose the night before surgery and none on the morning     surgery.   _x___ Follow recommendations from Cardiologist, Pulmonologist or PCP regarding          stopping Aspirin, Coumadin, Plavix ,Eliquis, Effient, or Pradaxa, and Pletal.  X____Stop Anti-inflammatories such as Advil, Aleve, Ibuprofen, Motrin, Naproxen, Naprosyn, Goodies powders or aspirin products. OK to take Tylenol and                          Celebrex.  Stop Ibuprofen today   _x___ Stop supplements until after surgery.  But may continue Vitamin D, Vitamin B,       and multivitamin.   ____ Bring C-Pap to the hospital.

## 2017-09-08 MED ORDER — CLINDAMYCIN PHOSPHATE 900 MG/50ML IV SOLN
900.0000 mg | INTRAVENOUS | Status: AC
  Administered 2017-09-09: 900 mg via INTRAVENOUS

## 2017-09-09 ENCOUNTER — Ambulatory Visit
Admission: RE | Admit: 2017-09-09 | Discharge: 2017-09-09 | Disposition: A | Discharge status: Home / Self Care | Payer: BLUE CROSS/BLUE SHIELD | Source: Ambulatory Visit | Attending: Podiatry | Admitting: Podiatry

## 2017-09-09 ENCOUNTER — Ambulatory Visit: Payer: BLUE CROSS/BLUE SHIELD | Admitting: Anesthesiology

## 2017-09-09 ENCOUNTER — Ambulatory Visit: Payer: BLUE CROSS/BLUE SHIELD

## 2017-09-09 ENCOUNTER — Encounter
Admission: RE | Disposition: A | Discharge status: Home / Self Care | Payer: Self-pay | Source: Ambulatory Visit | Attending: Podiatry

## 2017-09-09 ENCOUNTER — Other Ambulatory Visit: Payer: Self-pay

## 2017-09-09 DIAGNOSIS — I10 Essential (primary) hypertension: Secondary | ICD-10-CM | POA: Insufficient documentation

## 2017-09-09 DIAGNOSIS — Z87891 Personal history of nicotine dependence: Secondary | ICD-10-CM | POA: Insufficient documentation

## 2017-09-09 DIAGNOSIS — Z79899 Other long term (current) drug therapy: Secondary | ICD-10-CM | POA: Insufficient documentation

## 2017-09-09 DIAGNOSIS — K219 Gastro-esophageal reflux disease without esophagitis: Secondary | ICD-10-CM | POA: Diagnosis not present

## 2017-09-09 DIAGNOSIS — X501XXA Overexertion from prolonged static or awkward postures, initial encounter: Secondary | ICD-10-CM | POA: Diagnosis not present

## 2017-09-09 DIAGNOSIS — Z6841 Body Mass Index (BMI) 40.0 and over, adult: Secondary | ICD-10-CM | POA: Insufficient documentation

## 2017-09-09 DIAGNOSIS — T148XXA Other injury of unspecified body region, initial encounter: Secondary | ICD-10-CM

## 2017-09-09 DIAGNOSIS — S82852A Displaced trimalleolar fracture of left lower leg, initial encounter for closed fracture: Secondary | ICD-10-CM | POA: Diagnosis present

## 2017-09-09 DIAGNOSIS — M069 Rheumatoid arthritis, unspecified: Secondary | ICD-10-CM | POA: Insufficient documentation

## 2017-09-09 DIAGNOSIS — Y9301 Activity, walking, marching and hiking: Secondary | ICD-10-CM | POA: Insufficient documentation

## 2017-09-09 DIAGNOSIS — Y929 Unspecified place or not applicable: Secondary | ICD-10-CM | POA: Insufficient documentation

## 2017-09-09 DIAGNOSIS — M25572 Pain in left ankle and joints of left foot: Secondary | ICD-10-CM | POA: Diagnosis not present

## 2017-09-09 HISTORY — PX: ORIF ANKLE FRACTURE: SHX5408

## 2017-09-09 LAB — GLUCOSE, CAPILLARY
Glucose-Capillary: 125 mg/dL — ABNORMAL HIGH (ref 65–99)
Glucose-Capillary: 160 mg/dL — ABNORMAL HIGH (ref 65–99)

## 2017-09-09 SURGERY — OPEN REDUCTION INTERNAL FIXATION (ORIF) ANKLE FRACTURE
Anesthesia: General | Site: Ankle | Laterality: Left | Wound class: "Clean "

## 2017-09-09 MED ORDER — OXYCODONE-ACETAMINOPHEN 5-325 MG PO TABS
1.0000 | ORAL_TABLET | Freq: Once | ORAL | Status: AC
  Administered 2017-09-09: 1 via ORAL

## 2017-09-09 MED ORDER — OXYCODONE-ACETAMINOPHEN 5-325 MG PO TABS: 1.0000 | ORAL_TABLET | ORAL | 0 refills | Status: AC | PRN

## 2017-09-09 MED ORDER — LIDOCAINE HCL (CARDIAC) PF 100 MG/5ML IV SOSY
PREFILLED_SYRINGE | INTRAVENOUS | Status: DC | PRN
  Administered 2017-09-09: 100 mg via INTRAVENOUS

## 2017-09-09 MED ORDER — SUGAMMADEX SODIUM 200 MG/2ML IV SOLN
INTRAVENOUS | Status: DC | PRN
  Administered 2017-09-09: 260.4 mg via INTRAVENOUS

## 2017-09-09 MED ORDER — CLINDAMYCIN PHOSPHATE 900 MG/50ML IV SOLN
INTRAVENOUS | Status: AC
  Filled 2017-09-09: qty 50

## 2017-09-09 MED ORDER — FAMOTIDINE 20 MG PO TABS
ORAL_TABLET | ORAL | Status: AC
  Filled 2017-09-09: qty 1

## 2017-09-09 MED ORDER — ROPIVACAINE HCL 5 MG/ML IJ SOLN
INTRAMUSCULAR | Status: DC | PRN
  Administered 2017-09-09: 30 mL via EPIDURAL

## 2017-09-09 MED ORDER — POVIDONE-IODINE 7.5 % EX SOLN
Freq: Once | CUTANEOUS | Status: DC
  Filled 2017-09-09: qty 118

## 2017-09-09 MED ORDER — PROPOFOL 500 MG/50ML IV EMUL
INTRAVENOUS | Status: AC
  Filled 2017-09-09: qty 50

## 2017-09-09 MED ORDER — FENTANYL CITRATE (PF) 100 MCG/2ML IJ SOLN
50.0000 ug | Freq: Once | INTRAMUSCULAR | Status: AC
  Administered 2017-09-09: 50 ug via INTRAVENOUS

## 2017-09-09 MED ORDER — ONDANSETRON HCL 4 MG/2ML IJ SOLN
4.0000 mg | Freq: Once | INTRAMUSCULAR | Status: AC | PRN
  Administered 2017-09-09: 4 mg via INTRAVENOUS

## 2017-09-09 MED ORDER — PROPOFOL 10 MG/ML IV BOLUS
INTRAVENOUS | Status: AC
  Filled 2017-09-09: qty 20

## 2017-09-09 MED ORDER — DEXAMETHASONE SODIUM PHOSPHATE 10 MG/ML IJ SOLN
INTRAMUSCULAR | Status: AC
  Filled 2017-09-09: qty 1

## 2017-09-09 MED ORDER — BUPIVACAINE LIPOSOME 1.3 % IJ SUSP
INTRAMUSCULAR | Status: DC | PRN
  Administered 2017-09-09: 20 mL

## 2017-09-09 MED ORDER — FENTANYL CITRATE (PF) 100 MCG/2ML IJ SOLN
INTRAMUSCULAR | Status: DC | PRN
  Administered 2017-09-09 (×2): 50 ug via INTRAVENOUS
  Administered 2017-09-09: 100 ug via INTRAVENOUS
  Administered 2017-09-09: 50 ug via INTRAVENOUS

## 2017-09-09 MED ORDER — LIDOCAINE HCL (PF) 2 % IJ SOLN
INTRAMUSCULAR | Status: AC
  Filled 2017-09-09: qty 10

## 2017-09-09 MED ORDER — FENTANYL CITRATE (PF) 100 MCG/2ML IJ SOLN
INTRAMUSCULAR | Status: AC
  Administered 2017-09-09: 50 ug via INTRAVENOUS
  Filled 2017-09-09: qty 2

## 2017-09-09 MED ORDER — FAMOTIDINE 20 MG PO TABS
20.0000 mg | ORAL_TABLET | Freq: Once | ORAL | Status: AC
  Administered 2017-09-09: 20 mg via ORAL

## 2017-09-09 MED ORDER — MIDAZOLAM HCL 2 MG/2ML IJ SOLN
1.0000 mg | Freq: Once | INTRAMUSCULAR | Status: AC
  Administered 2017-09-09: 1 mg via INTRAVENOUS

## 2017-09-09 MED ORDER — HYDROMORPHONE HCL 1 MG/ML IJ SOLN
INTRAMUSCULAR | Status: AC
  Administered 2017-09-09: 0.5 mg via INTRAVENOUS
  Filled 2017-09-09: qty 1

## 2017-09-09 MED ORDER — PROPOFOL 10 MG/ML IV BOLUS
INTRAVENOUS | Status: DC | PRN
  Administered 2017-09-09: 200 mg via INTRAVENOUS

## 2017-09-09 MED ORDER — ROCURONIUM BROMIDE 50 MG/5ML IV SOLN
INTRAVENOUS | Status: AC
  Filled 2017-09-09: qty 1

## 2017-09-09 MED ORDER — SUCCINYLCHOLINE CHLORIDE 20 MG/ML IJ SOLN
INTRAMUSCULAR | Status: AC
  Filled 2017-09-09: qty 1

## 2017-09-09 MED ORDER — FENTANYL CITRATE (PF) 100 MCG/2ML IJ SOLN
25.0000 ug | INTRAMUSCULAR | Status: DC | PRN
  Administered 2017-09-09 (×4): 25 ug via INTRAVENOUS

## 2017-09-09 MED ORDER — SODIUM CHLORIDE 0.9 % IV SOLN
INTRAVENOUS | Status: DC
  Administered 2017-09-09 (×2): via INTRAVENOUS

## 2017-09-09 MED ORDER — HYDROMORPHONE HCL 1 MG/ML IJ SOLN
0.2500 mg | INTRAMUSCULAR | Status: DC | PRN
  Administered 2017-09-09 (×2): 0.5 mg via INTRAVENOUS

## 2017-09-09 MED ORDER — ONDANSETRON HCL 4 MG/2ML IJ SOLN
INTRAMUSCULAR | Status: AC
  Filled 2017-09-09: qty 2

## 2017-09-09 MED ORDER — PHENYLEPHRINE HCL 10 MG/ML IJ SOLN
INTRAMUSCULAR | Status: DC | PRN
  Administered 2017-09-09: 100 ug via INTRAVENOUS
  Administered 2017-09-09: 200 ug via INTRAVENOUS
  Administered 2017-09-09 (×4): 100 ug via INTRAVENOUS

## 2017-09-09 MED ORDER — FENTANYL CITRATE (PF) 100 MCG/2ML IJ SOLN
50.0000 ug | Freq: Once | INTRAMUSCULAR | Status: AC
Start: 2017-09-09 — End: 2017-09-09
  Administered 2017-09-09: 50 ug via INTRAVENOUS

## 2017-09-09 MED ORDER — ONDANSETRON HCL 4 MG/2ML IJ SOLN
INTRAMUSCULAR | Status: DC | PRN
  Administered 2017-09-09: 4 mg via INTRAVENOUS

## 2017-09-09 MED ORDER — SUCCINYLCHOLINE CHLORIDE 20 MG/ML IJ SOLN
INTRAMUSCULAR | Status: DC | PRN
  Administered 2017-09-09: 120 mg via INTRAVENOUS

## 2017-09-09 MED ORDER — MIDAZOLAM HCL 2 MG/2ML IJ SOLN
INTRAMUSCULAR | Status: AC
  Administered 2017-09-09: 1 mg via INTRAVENOUS
  Filled 2017-09-09: qty 2

## 2017-09-09 MED ORDER — ROPIVACAINE HCL 5 MG/ML IJ SOLN
INTRAMUSCULAR | Status: AC
  Filled 2017-09-09: qty 30

## 2017-09-09 MED ORDER — SUGAMMADEX SODIUM 200 MG/2ML IV SOLN
INTRAVENOUS | Status: AC
  Filled 2017-09-09: qty 2

## 2017-09-09 MED ORDER — PROMETHAZINE HCL 12.5 MG PO TABS: 12.5000 mg | ORAL_TABLET | Freq: Four times a day (QID) | ORAL | 0 refills | Status: DC | PRN

## 2017-09-09 MED ORDER — MIDAZOLAM HCL 2 MG/2ML IJ SOLN
INTRAMUSCULAR | Status: AC
  Filled 2017-09-09: qty 2

## 2017-09-09 MED ORDER — EPHEDRINE SULFATE 50 MG/ML IJ SOLN
INTRAMUSCULAR | Status: DC | PRN
  Administered 2017-09-09: 15 mg via INTRAVENOUS
  Administered 2017-09-09: 5 mg via INTRAVENOUS

## 2017-09-09 MED ORDER — ROCURONIUM BROMIDE 100 MG/10ML IV SOLN
INTRAVENOUS | Status: DC | PRN
  Administered 2017-09-09: 10 mg via INTRAVENOUS
  Administered 2017-09-09: 40 mg via INTRAVENOUS
  Administered 2017-09-09: 10 mg via INTRAVENOUS

## 2017-09-09 MED ORDER — MIDAZOLAM HCL 2 MG/2ML IJ SOLN
INTRAMUSCULAR | Status: DC | PRN
  Administered 2017-09-09: 2 mg via INTRAVENOUS

## 2017-09-09 MED ORDER — OXYCODONE-ACETAMINOPHEN 5-325 MG PO TABS
ORAL_TABLET | ORAL | Status: AC
  Administered 2017-09-09: 1 via ORAL
  Filled 2017-09-09: qty 1

## 2017-09-09 MED ORDER — DEXAMETHASONE SODIUM PHOSPHATE 10 MG/ML IJ SOLN
INTRAMUSCULAR | Status: DC | PRN
  Administered 2017-09-09: 10 mg via INTRAVENOUS

## 2017-09-09 MED ORDER — FENTANYL CITRATE (PF) 100 MCG/2ML IJ SOLN
INTRAMUSCULAR | Status: AC
  Filled 2017-09-09: qty 2

## 2017-09-09 MED ORDER — FENTANYL CITRATE (PF) 250 MCG/5ML IJ SOLN
INTRAMUSCULAR | Status: AC
  Filled 2017-09-09: qty 5

## 2017-09-09 MED ORDER — BUPIVACAINE HCL (PF) 0.5 % IJ SOLN
INTRAMUSCULAR | Status: DC | PRN
  Administered 2017-09-09: 20 mL

## 2017-09-09 SURGICAL SUPPLY — 55 items
BANDAGE ELASTIC 4 LF NS (GAUZE/BANDAGES/DRESSINGS) ×2 IMPLANT
BIT DRILL 2.5X2.75 QC CALB (BIT) ×1 IMPLANT
BIT DRILL 2.9 CANN QC NONSTRL (BIT) ×1 IMPLANT
BLADE SURG 15 STRL LF DISP TIS (BLADE) IMPLANT
BLADE SURG 15 STRL SS (BLADE)
BNDG COHESIVE 4X5 TAN STRL (GAUZE/BANDAGES/DRESSINGS) ×2 IMPLANT
BNDG CONFORM 3 STRL LF (GAUZE/BANDAGES/DRESSINGS) ×2 IMPLANT
BNDG ESMARK 4X12 TAN STRL LF (GAUZE/BANDAGES/DRESSINGS) ×2 IMPLANT
BNDG GAUZE 4.5X4.1 6PLY STRL (MISCELLANEOUS) ×2 IMPLANT
CANISTER SUCT 1200ML W/VALVE (MISCELLANEOUS) ×2 IMPLANT
DRAPE C-ARM XRAY 36X54 (DRAPES) ×2 IMPLANT
DRAPE C-ARMOR (DRAPES) ×2 IMPLANT
DRAPE INCISE IOBAN 66X45 STRL (DRAPES) ×1 IMPLANT
DURAPREP 26ML APPLICATOR (WOUND CARE) ×2 IMPLANT
ELECT REM PT RETURN 9FT ADLT (ELECTROSURGICAL) ×2
ELECTRODE REM PT RTRN 9FT ADLT (ELECTROSURGICAL) ×1 IMPLANT
GAUZE PETRO XEROFOAM 1X8 (MISCELLANEOUS) ×2 IMPLANT
GAUZE SPONGE 4X4 12PLY STRL (GAUZE/BANDAGES/DRESSINGS) ×2 IMPLANT
GAUZE STRETCH 2X75IN STRL (MISCELLANEOUS) ×2 IMPLANT
GAUZE XEROFORM 4X4 STRL (GAUZE/BANDAGES/DRESSINGS) ×1 IMPLANT
GLOVE BIO SURGEON STRL SZ7.5 (GLOVE) ×2 IMPLANT
GLOVE INDICATOR 8.0 STRL GRN (GLOVE) ×2 IMPLANT
GOWN STRL REUS W/ TWL LRG LVL3 (GOWN DISPOSABLE) ×3 IMPLANT
GOWN STRL REUS W/TWL LRG LVL3 (GOWN DISPOSABLE) ×3
K-WIRE ACE 1.6X6 (WIRE) ×6
KIT TURNOVER KIT A (KITS) ×2 IMPLANT
KWIRE ACE 1.6X6 (WIRE) IMPLANT
LABEL OR SOLS (LABEL) ×2 IMPLANT
NS IRRIG 500ML POUR BTL (IV SOLUTION) ×4 IMPLANT
PACK EXTREMITY ARMC (MISCELLANEOUS) ×2 IMPLANT
PAD PREP 24X41 OB/GYN DISP (PERSONAL CARE ITEMS) ×2 IMPLANT
PLATE LOCK 8H 103 BILAT FIB (Plate) ×1 IMPLANT
PUTTY DBX 1CC (Putty) ×2 IMPLANT
PUTTY DBX 1CC DEPUY (Putty) IMPLANT
SCREW ACE CAN 4.0 50M (Screw) ×3 IMPLANT
SCREW LOCK CORT STAR 3.5X12 (Screw) ×2 IMPLANT
SCREW LOCK CORT STAR 3.5X14 (Screw) ×4 IMPLANT
SCREW NON LOCKING LP 3.5 16MM (Screw) ×1 IMPLANT
SPLINT CAST 1 STEP 4X30 (MISCELLANEOUS) ×2 IMPLANT
SPLINT FAST PLASTER 5X30 (CAST SUPPLIES) ×1
SPLINT PLASTER CAST FAST 5X30 (CAST SUPPLIES) ×1 IMPLANT
SPONGE LAP 18X18 5 PK (GAUZE/BANDAGES/DRESSINGS) ×1 IMPLANT
SPONGE LAP 18X18 RF (DISPOSABLE) ×2 IMPLANT
STAPLER SKIN PROX 35W (STAPLE) ×2 IMPLANT
STOCKINETTE M/LG 89821 (MISCELLANEOUS) ×2 IMPLANT
STRAP SAFETY 5IN WIDE (MISCELLANEOUS) ×2 IMPLANT
STRIP CLOSURE SKIN 1/2X4 (GAUZE/BANDAGES/DRESSINGS) IMPLANT
SUT VIC AB 2-0 CT1 27 (SUTURE) ×3
SUT VIC AB 2-0 CT1 TAPERPNT 27 (SUTURE) ×1 IMPLANT
SUT VIC AB 2-0 CT2 27 (SUTURE) ×1 IMPLANT
SUT VIC AB 3-0 SH 27 (SUTURE) ×3
SUT VIC AB 3-0 SH 27X BRD (SUTURE) ×1 IMPLANT
SWABSTK COMLB BENZOIN TINCTURE (MISCELLANEOUS) IMPLANT
SYR 10ML LL (SYRINGE) ×2 IMPLANT
SYR 20CC LL (SYRINGE) ×2 IMPLANT

## 2017-09-09 NOTE — Op Note (Signed)
Operative note   Surgeon:Jevon Shells Armed forces logistics/support/administrative officer: None    Preop diagnosis: Trimalleolar left ankle fracture    Postop diagnosis: Same    Procedure: ORIF trimalleolar ankle fracture    EBL: 150 mL's    Anesthesia:regional and general    Hemostasis: Thigh tourniquet inflated to 300 mmHg for approximately 60 minutes    Specimen: None    Complications: None    Operative indications:Ellen Garcia is an 62 y.o. that presents today for surgical intervention.  The risks/benefits/alternatives/complications have been discussed and consent has been given.    Procedure:  Patient was brought into the OR and placed on the operating table in thesupine position. After anesthesia was obtained theleft lower extremity was prepped and draped in usual sterile fashion.  Attention was directed to the lateral aspect of the left ankle where a longitudinal incision performed over the fibula.  Sharp and blunt dissection carried down to the periosteum.  Subperiosteal dissection was then undertaken.  At this time there was noted to be a markedly displaced distal fibular fracture.  This was a spiral oblique type of fracture.  Reduction was performed and I was able to reduce the fracture into a better aligned position.  Still some lateral shift and noted lateral shift of the medial malleolus was noted with fluoroscopy.  At this time we elected to open the medial aspect.  Medial malleolar incision was performed.  Sharp and blunt dissection carried down to the periosteum.  Subperiosteal dissection was then undertaken.  At this time the medial malleolar fracture fragment was noted.  There was noted to be along the medial wall market comminution.  I was able to reduce the fracture at the ankle joint region at the tibiotalar portion of the ankle joint.  Less comminution within the joint region.  After stabilization and realignment I then returned to the lateral incision and reduction of the fibular fracture was  performed with bone reduction clamps.  Next a 8 hole Biomet composite plate was placed on the lateral aspect.  Locking screws were placed proximal and distal to the fracture site.  Good stability was noted at this time.  Attention was redirected to the medial malleoli region.  At this time 3 guidewires for 4 oh cannulated screws were placed.  These were filled with 4.0 mm x 50 mm partially-threaded cannulated screws.  Good stability was noted.  The comminuted area on the medial aspect of the medial malleolar wall was packed with 1 cc of DBX bone graft material.  Wounds were all flushed with copious amounts of irrigation.  Closure was then performed with combination of 2-0 and 3-0 Vicryl and skin staples.  The wound was infiltrated with a total of 40 cc of a one-to-one mixture of 0.25% bupivacaine and Exparel long-acting anesthetic.  A well compressive sterile dressing with an equalizer walker boot was then applied to the left lower extremity.    Patient tolerated the procedure and anesthesia well.  Was transported from the OR to the PACU with all vital signs stable and vascular status intact. To be discharged per routine protocol.  Will follow up in approximately 1 week in the outpatient clinic.

## 2017-09-09 NOTE — H&P (Signed)
HISTORY AND PHYSICAL INTERVAL NOTE:  09/09/2017  1:49 PM  Ellen Garcia  has presented today for surgery, with the diagnosis of Left ankle fracture.  The various methods of treatment have been discussed with the patient.  No guarantees were given.  After consideration of risks, benefits and other options for treatment, the patient has consented to surgery.  I have reviewed the patients' chart and labs.    Patient Vitals for the past 24 hrs:  BP Temp Temp src Pulse Resp SpO2 Weight  09/09/17 1333 (!) 179/95 - - 80 19 100 % -  09/09/17 1301 (!) 161/86 97.7 F (36.5 C) Temporal 96 20 100 % 130.2 kg (287 lb)    A history and physical examination was performed in my office.  The patient was reexamined.  There have been no changes to this history and physical examination.  Gwyneth Revels A

## 2017-09-09 NOTE — Anesthesia Post-op Follow-up Note (Signed)
Anesthesia QCDR form completed.        

## 2017-09-09 NOTE — Discharge Instructions (Signed)
°  Do not put any weight on the left foot. Non weight bearing.  Elevate left leg. Reinforce dressing as needed.     AMBULATORY SURGERY  DISCHARGE INSTRUCTIONS   1) The drugs that you were given will stay in your system until tomorrow so for the next 24 hours you should not:  A) Drive an automobile B) Make any legal decisions C) Drink any alcoholic beverage   2) You may resume regular meals tomorrow.  Today it is better to start with liquids and gradually work up to solid foods.  You may eat anything you prefer, but it is better to start with liquids, then soup and crackers, and gradually work up to solid foods.   3) Please notify your doctor immediately if you have any unusual bleeding, trouble breathing, redness and pain at the surgery site, drainage, fever, or pain not relieved by medication.    4) Additional Instructions:        Please contact your physician with any problems or Same Day Surgery at [phone], Monday through Friday 6 am to 4 pm, or [facility] at [facility] number at [phone].

## 2017-09-09 NOTE — Transfer of Care (Signed)
Immediate Anesthesia Transfer of Care Note  Patient: Ellen Garcia  Procedure(s) Performed: OPEN REDUCTION INTERNAL FIXATION (ORIF) ANKLE FRACTURE-TRIMALLEOLAR (Left Ankle)  Patient Location: PACU  Anesthesia Type:General  Level of Consciousness: awake, alert  and oriented  Airway & Oxygen Therapy: Patient Spontanous Breathing and Patient connected to face mask oxygen  Post-op Assessment: Report given to RN and Post -op Vital signs reviewed and stable  Post vital signs: Reviewed and stable  Last Vitals:  Vitals Value Taken Time  BP    Temp    Pulse    Resp    SpO2      Last Pain:  Vitals:   09/09/17 1301  TempSrc: Temporal  PainSc: 0-No pain         Complications: No apparent anesthesia complications

## 2017-09-09 NOTE — Anesthesia Procedure Notes (Signed)
Procedure Name: Intubation Date/Time: 09/09/2017 2:29 PM Performed by: Eben Burow, CRNA Pre-anesthesia Checklist: Patient identified, Emergency Drugs available, Suction available, Patient being monitored and Timeout performed Patient Re-evaluated:Patient Re-evaluated prior to induction Oxygen Delivery Method: Circle system utilized Preoxygenation: Pre-oxygenation with 100% oxygen Induction Type: IV induction Ventilation: Mask ventilation without difficulty Laryngoscope Size: McGraph and 3 Grade View: Grade I Tube type: Oral Tube size: 7.5 mm Number of attempts: 1 Airway Equipment and Method: Stylet,  Video-laryngoscopy and LTA kit utilized Placement Confirmation: ETT inserted through vocal cords under direct vision,  positive ETCO2 and breath sounds checked- equal and bilateral Secured at: 20 cm Tube secured with: Tape Dental Injury: Teeth and Oropharynx as per pre-operative assessment

## 2017-09-09 NOTE — Anesthesia Procedure Notes (Signed)
Anesthesia Regional Block: Popliteal block   Pre-Anesthetic Checklist: ,, timeout performed, Correct Patient, Correct Site, Correct Laterality, Correct Procedure, Correct Position, site marked, Risks and benefits discussed,  Surgical consent,  Pre-op evaluation,  At surgeon's request and post-op pain management  Laterality: Lower  Prep: chloraprep       Needles:  Injection technique: Single-shot  Needle Type: Echogenic Needle     Needle Length: 9cm  Needle Gauge: 21     Additional Needles:   Procedures:,,,, ultrasound used (permanent image in chart),,,,  Narrative:  Injection made incrementally with aspirations every 5 mL.  Performed by: Personally  Anesthesiologist: Piscitello, Joseph K, MD  Additional Notes: Functioning IV was confirmed and monitors were applied.  A echogenic needle was used. Sterile prep,hand hygiene and sterile gloves were used. Minimal sedation used for procedure.   No paresthesia endorsed by patient during the procedure.  Negative aspiration and negative test dose prior to incremental administration of local anesthetic. The patient tolerated the procedure well with no immediate complications.      

## 2017-09-09 NOTE — Anesthesia Preprocedure Evaluation (Addendum)
Anesthesia Evaluation  Patient identified by MRN, date of birth, ID band Patient awake    Reviewed: Allergy & Precautions, NPO status , Patient's Chart, lab work & pertinent test results, reviewed documented beta blocker date and time   Airway Mallampati: III  TM Distance: >3 FB     Dental  (+) Chipped, Missing   Pulmonary former smoker,           Cardiovascular hypertension, Pt. on medications      Neuro/Psych    GI/Hepatic   Endo/Other  Morbid obesity  Renal/GU      Musculoskeletal  (+) Arthritis , Rheumatoid disorders,    Abdominal   Peds  Hematology   Anesthesia Other Findings EKG ok. Pop block.  Reproductive/Obstetrics                            Anesthesia Physical Anesthesia Plan  ASA: III  Anesthesia Plan: General   Post-op Pain Management:    Induction: Intravenous  PONV Risk Score and Plan:   Airway Management Planned: LMA  Additional Equipment:   Intra-op Plan:   Post-operative Plan:   Informed Consent: I have reviewed the patients History and Physical, chart, labs and discussed the procedure including the risks, benefits and alternatives for the proposed anesthesia with the patient or authorized representative who has indicated his/her understanding and acceptance.     Plan Discussed with: CRNA  Anesthesia Plan Comments:         Anesthesia Quick Evaluation

## 2017-09-11 NOTE — Anesthesia Postprocedure Evaluation (Signed)
Anesthesia Post Note  Patient: Ellen Garcia  Procedure(s) Performed: OPEN REDUCTION INTERNAL FIXATION (ORIF) ANKLE FRACTURE-TRIMALLEOLAR (Left Ankle)  Patient location during evaluation: PACU Anesthesia Type: General Level of consciousness: awake and alert Pain management: pain level controlled Vital Signs Assessment: post-procedure vital signs reviewed and stable Respiratory status: spontaneous breathing, nonlabored ventilation, respiratory function stable and patient connected to nasal cannula oxygen Cardiovascular status: blood pressure returned to baseline and stable Postop Assessment: no apparent nausea or vomiting Anesthetic complications: no     Last Vitals:  Vitals:   09/09/17 1830 09/09/17 1849  BP: 124/63 140/80  Pulse: 88 83  Resp: 18 18  Temp:  36.6 C  SpO2: 98% 100%    Last Pain:  Vitals:   09/09/17 1849  TempSrc:   PainSc: 5                  Anvika Gashi S

## 2017-09-12 ENCOUNTER — Encounter: Payer: Self-pay | Admitting: Podiatry

## 2017-09-13 ENCOUNTER — Encounter: Payer: Self-pay | Admitting: Podiatry

## 2018-12-22 ENCOUNTER — Other Ambulatory Visit
Admission: RE | Admit: 2018-12-22 | Discharge: 2018-12-22 | Disposition: A | Discharge status: Home / Self Care | Payer: BLUE CROSS/BLUE SHIELD | Source: Ambulatory Visit | Attending: Internal Medicine | Admitting: Internal Medicine

## 2018-12-22 ENCOUNTER — Other Ambulatory Visit: Payer: Self-pay

## 2018-12-22 DIAGNOSIS — Z01812 Encounter for preprocedural laboratory examination: Secondary | ICD-10-CM | POA: Diagnosis present

## 2018-12-22 DIAGNOSIS — Z20828 Contact with and (suspected) exposure to other viral communicable diseases: Secondary | ICD-10-CM | POA: Diagnosis not present

## 2018-12-22 LAB — SARS CORONAVIRUS 2 (TAT 6-24 HRS): SARS Coronavirus 2: NEGATIVE

## 2018-12-26 ENCOUNTER — Encounter: Payer: Self-pay | Admitting: *Deleted

## 2018-12-26 ENCOUNTER — Ambulatory Visit: Payer: BLUE CROSS/BLUE SHIELD | Admitting: Certified Registered Nurse Anesthetist

## 2018-12-26 ENCOUNTER — Ambulatory Visit
Admission: RE | Admit: 2018-12-26 | Discharge: 2018-12-26 | Disposition: A | Discharge status: Home / Self Care | Payer: BLUE CROSS/BLUE SHIELD | Attending: Internal Medicine | Admitting: Internal Medicine

## 2018-12-26 ENCOUNTER — Other Ambulatory Visit: Payer: Self-pay

## 2018-12-26 ENCOUNTER — Encounter
Admission: RE | Disposition: A | Discharge status: Home / Self Care | Payer: Self-pay | Source: Home / Self Care | Attending: Internal Medicine

## 2018-12-26 DIAGNOSIS — K640 First degree hemorrhoids: Secondary | ICD-10-CM | POA: Diagnosis not present

## 2018-12-26 DIAGNOSIS — I10 Essential (primary) hypertension: Secondary | ICD-10-CM | POA: Diagnosis not present

## 2018-12-26 DIAGNOSIS — M069 Rheumatoid arthritis, unspecified: Secondary | ICD-10-CM | POA: Insufficient documentation

## 2018-12-26 DIAGNOSIS — E669 Obesity, unspecified: Secondary | ICD-10-CM | POA: Insufficient documentation

## 2018-12-26 DIAGNOSIS — Z79899 Other long term (current) drug therapy: Secondary | ICD-10-CM | POA: Insufficient documentation

## 2018-12-26 DIAGNOSIS — Z87891 Personal history of nicotine dependence: Secondary | ICD-10-CM | POA: Insufficient documentation

## 2018-12-26 DIAGNOSIS — K5730 Diverticulosis of large intestine without perforation or abscess without bleeding: Secondary | ICD-10-CM | POA: Diagnosis not present

## 2018-12-26 DIAGNOSIS — Z1211 Encounter for screening for malignant neoplasm of colon: Secondary | ICD-10-CM | POA: Insufficient documentation

## 2018-12-26 DIAGNOSIS — Z6841 Body Mass Index (BMI) 40.0 and over, adult: Secondary | ICD-10-CM | POA: Insufficient documentation

## 2018-12-26 HISTORY — PX: COLONOSCOPY WITH PROPOFOL: SHX5780

## 2018-12-26 SURGERY — COLONOSCOPY WITH PROPOFOL
Anesthesia: General

## 2018-12-26 MED ORDER — LIDOCAINE HCL (PF) 2 % IJ SOLN
INTRAMUSCULAR | Status: AC
  Filled 2018-12-26: qty 70

## 2018-12-26 MED ORDER — PHENYLEPHRINE HCL (PRESSORS) 10 MG/ML IV SOLN
INTRAVENOUS | Status: AC
  Filled 2018-12-26: qty 1

## 2018-12-26 MED ORDER — LIDOCAINE HCL (CARDIAC) PF 100 MG/5ML IV SOSY
PREFILLED_SYRINGE | INTRAVENOUS | Status: DC | PRN
  Administered 2018-12-26: 100 mg via INTRATRACHEAL

## 2018-12-26 MED ORDER — PROPOFOL 500 MG/50ML IV EMUL
INTRAVENOUS | Status: AC
  Filled 2018-12-26: qty 50

## 2018-12-26 MED ORDER — PROPOFOL 500 MG/50ML IV EMUL
INTRAVENOUS | Status: AC
  Filled 2018-12-26: qty 350

## 2018-12-26 MED ORDER — EPHEDRINE SULFATE 50 MG/ML IJ SOLN
INTRAMUSCULAR | Status: AC
  Filled 2018-12-26: qty 1

## 2018-12-26 MED ORDER — GLYCOPYRROLATE 0.2 MG/ML IJ SOLN
INTRAMUSCULAR | Status: AC
  Filled 2018-12-26: qty 3

## 2018-12-26 MED ORDER — PROPOFOL 500 MG/50ML IV EMUL
INTRAVENOUS | Status: DC | PRN
  Administered 2018-12-26: 150 ug/kg/min via INTRAVENOUS

## 2018-12-26 MED ORDER — SODIUM CHLORIDE 0.9 % IV SOLN
INTRAVENOUS | Status: DC
  Administered 2018-12-26: 07:00:00 1000 mL via INTRAVENOUS

## 2018-12-26 NOTE — Transfer of Care (Signed)
Immediate Anesthesia Transfer of Care Note  Patient: Ellen Garcia  Procedure(s) Performed: COLONOSCOPY WITH PROPOFOL (N/A )  Patient Location: Endoscopy Unit  Anesthesia Type:General  Level of Consciousness: awake, alert , oriented and patient cooperative  Airway & Oxygen Therapy: Patient Spontanous Breathing and Patient connected to face mask oxygen  Post-op Assessment: Report given to RN and Post -op Vital signs reviewed and stable  Post vital signs: Reviewed and stable  Last Vitals:  Vitals Value Taken Time  BP 144/65 12/26/18 0855  Temp 36.2 C 12/26/18 0855  Pulse 78 12/26/18 0855  Resp 26 12/26/18 0855  SpO2 100 % 12/26/18 0855    Last Pain:  Vitals:   12/26/18 0855  TempSrc:   PainSc: 0-No pain         Complications: No apparent anesthesia complications

## 2018-12-26 NOTE — Op Note (Signed)
[facility] Gastroenterology Patient Name: Ellen Garcia Procedure Date: 12/26/2018 7:35 AM MRN: [id number] Account #: [id number] Date of Birth: March 16, 1956 Admit Type: Outpatient Age: 63 Room: [facility] ENDO ROOM 3 Gender: Female Note Status: Finalized Procedure:            Colonoscopy Indications:          Screening for colorectal malignant neoplasm Providers:            Boykin Nearing. Norma Fredrickson MD, MD Referring MD:         Craig Guess, MD (Referring MD) Medicines:            Propofol per Anesthesia Complications:        No immediate complications. Procedure:            Pre-Anesthesia Assessment:                       - The risks and benefits of the procedure and the                        sedation options and risks were discussed with the                        patient. All questions were answered and informed                        consent was obtained.                       - Patient identification and proposed procedure were                        verified prior to the procedure by the nurse. The                        procedure was verified in the procedure room.                       - ASA Grade Assessment: III - A patient with severe                        systemic disease.                       - After reviewing the risks and benefits, the patient                        was deemed in satisfactory condition to undergo the                        procedure.                       After obtaining informed consent, the colonoscope was                        passed under direct vision. Throughout the procedure,                        the patient's blood pressure, pulse, and oxygen  saturations were monitored continuously. The                        Colonoscope was introduced through the anus and                        advanced to the the cecum, identified by appendiceal                        orifice and ileocecal valve. The colonoscopy was                      performed without difficulty. The patient tolerated the                        procedure well. The quality of the bowel preparation                        was good. The ileocecal valve, appendiceal orifice, and                        rectum were photographed. Findings:      The perianal and digital rectal examinations were normal. Pertinent       negatives include normal sphincter tone and no palpable rectal lesions.      Many small and large-mouthed diverticula were found in the entire colon.       There was no evidence of diverticular bleeding.      Non-bleeding internal hemorrhoids were found during retroflexion. The       hemorrhoids were Grade I (internal hemorrhoids that do not prolapse).      The exam was otherwise without abnormality. Impression:           - Mild diverticulosis in the entire examined colon.                        There was no evidence of diverticular bleeding.                       - Non-bleeding internal hemorrhoids.                       - The examination was otherwise normal.                       - No specimens collected. Recommendation:       - Patient has a contact number available for                        emergencies. The signs and symptoms of potential                        delayed complications were discussed with the patient.                        Return to normal activities tomorrow. Written discharge                        instructions were provided to the patient.                       - Resume previous  diet.                       - Continue present medications.                       - Repeat colonoscopy in 10 years for screening purposes.                       - Return to GI office PRN.                       - The findings and recommendations were discussed with                        the patient. Procedure Code(s):    --- Professional ---                       Z6109, Colorectal cancer screening; colonoscopy on                         individual not meeting criteria for high risk Diagnosis Code(s):    --- Professional ---                       K57.30, Diverticulosis of large intestine without                        perforation or abscess without bleeding                       K64.0, First degree hemorrhoids                       Z12.11, Encounter for screening for malignant neoplasm                        of colon CPT copyright 2019 [employer]. All rights reserved. The codes documented in this report are preliminary and upon coder review may  be revised to meet current compliance requirements. Efrain Sella MD, MD 12/26/2018 8:54:42 AM This report has been signed electronically. Number of Addenda: 0 Note Initiated On: 12/26/2018 7:35 AM Scope Withdrawal Time: 0 hours 6 minutes 17 seconds  Total Procedure Duration: 0 hours 12 minutes 48 seconds  Estimated Blood Loss: Estimated blood loss: none.      [facility]

## 2018-12-26 NOTE — H&P (Signed)
Outpatient short stay form Pre-procedure 12/26/2018 8:23 AM Krystle Polcyn K. Alice Reichert, M.D.  Primary Physician: Harrel Lemon, M.D.  Reason for visit:  Colon cancer screening  History of present illness: Patient presents for colonoscopy for colon cancer screening. The patient denies complaints of abdominal pain, significant change in bowel habits, or rectal bleeding.     Current Facility-Administered Medications:  .  0.9 %  sodium chloride infusion, , Intravenous, Continuous, [place], Benay Pike, MD, Last Rate: 20 mL/hr at 12/26/18 0725, 1,000 mL at 12/26/18 0725  Medications Prior to Admission  Medication Sig Dispense Refill Last Dose  . acetaminophen (TYLENOL) 500 MG tablet Take 1,000 mg by mouth every 6 (six) hours as needed for moderate pain or headache.      . b complex vitamins tablet Take 1 tablet by mouth daily.     . Biotin 1000 MCG tablet Take 2,000 mcg by mouth daily.      . Cholecalciferol (VITAMIN D3) 5000 units CAPS Take 5,000 Units by mouth daily.     . diphenhydrAMINE (BENADRYL) 25 mg capsule Take 50 mg by mouth at bedtime as needed for allergies or sleep.     Marland Kitchen docusate sodium (COLACE) 100 MG capsule Take 1 capsule (100 mg total) by mouth 2 (two) times daily. (Patient not taking: Reported on 09/07/2017) 10 capsule 0   . gabapentin (NEURONTIN) 100 MG capsule Take 200 mg by mouth 3 (three) times daily.  6   . Green Tea 315 MG CAPS Take 315 mg by mouth 2 (two) times daily.     Marland Kitchen HYDROcodone-acetaminophen (NORCO/VICODIN) 5-325 MG tablet Take 1 tablet by mouth every 6 (six) hours as needed for moderate pain.  0 12/24/2018  . ibuprofen (ADVIL,MOTRIN) 800 MG tablet Take 800 mg by mouth every 6 (six) hours as needed for headache or moderate pain.     . promethazine (PHENERGAN) 12.5 MG tablet Take 1 tablet (12.5 mg total) by mouth every 6 (six) hours as needed for nausea. 21 tablet 0   . tiZANidine (ZANAFLEX) 4 MG tablet Take 4 mg by mouth 3 times daily as needed for muscle spasms   12/24/2018      Allergies  Allergen Reactions  . Hydrochlorothiazide Other (See Comments)    Caused hair loss  . Penicillin V Potassium Hives and Other (See Comments)    As a child  . Penicillins Hives and Other (See Comments)    Has patient had a PCN reaction causing immediate rash, facial/tongue/throat swelling, SOB or lightheadedness with hypotension: Yes Has patient had a PCN reaction causing severe rash involving mucus membranes or skin necrosis: Yes Has patient had a PCN reaction that required hospitalization: No Has patient had a PCN reaction occurring within the last 10 years: No If all of the above answers are "NO", then may proceed with Cephalosporin use.   . Zanaflex [Tizanidine]   . Lisinopril Rash and Other (See Comments)    Caused rash on hands     Past Medical History:  Diagnosis Date  . Ankle fracture   . Hypertension    denies taking medication  . Neuropathy    legs  . Neuropathy   . Pre-diabetes    diet controlled  . Rheumatoid arthritis ([place])     Review of systems:  Otherwise negative.    Physical Exam  Gen: Alert, oriented. Appears stated age.  HEENT: [place]/AT. PERRLA. Lungs: CTA, no wheezes. CV: RR nl S1, S2. Abd: soft, benign, no masses. BS+ Ext: No edema. Pulses 2+  Planned procedures: Proceed with colonoscopy. The patient understands the nature of the planned procedure, indications, risks, alternatives and potential complications including but not limited to bleeding, infection, perforation, damage to internal organs and possible oversedation/side effects from anesthesia. The patient agrees and gives consent to proceed.  Please refer to procedure notes for findings, recommendations and patient disposition/instructions.     Bastien Strawser K. Norma Fredrickson, M.D. Gastroenterology 12/26/2018  8:23 AM

## 2018-12-26 NOTE — Anesthesia Post-op Follow-up Note (Signed)
Anesthesia QCDR form completed.        

## 2018-12-26 NOTE — Anesthesia Preprocedure Evaluation (Signed)
Anesthesia Evaluation  Patient identified by MRN, date of birth, ID band Patient awake    Reviewed: Allergy & Precautions, NPO status , Patient's Chart, lab work & pertinent test results  History of Anesthesia Complications Negative for: history of anesthetic complications  Airway Mallampati: II  TM Distance: >3 FB Neck ROM: Full    Dental no notable dental hx.    Pulmonary neg sleep apnea, neg COPD, former smoker,    breath sounds clear to auscultation- rhonchi (-) wheezing      Cardiovascular hypertension, Pt. on medications (-) CAD, (-) Past MI, (-) Cardiac Stents and (-) CABG  Rhythm:Regular Rate:Normal - Systolic murmurs and - Diastolic murmurs    Neuro/Psych neg Seizures negative neurological ROS  negative psych ROS   GI/Hepatic negative GI ROS, Neg liver ROS,   Endo/Other  negative endocrine ROSneg diabetes  Renal/GU negative Renal ROS     Musculoskeletal  (+) Arthritis ,   Abdominal (+) + obese,   Peds  Hematology negative hematology ROS (+)   Anesthesia Other Findings Past Medical History: No date: Ankle fracture No date: Hypertension     Comment:  denies taking medication No date: Neuropathy     Comment:  legs No date: Neuropathy No date: Pre-diabetes     Comment:  diet controlled No date: Rheumatoid arthritis (HCC)   Reproductive/Obstetrics                             Anesthesia Physical Anesthesia Plan  ASA: II  Anesthesia Plan: General   Post-op Pain Management:    Induction: Intravenous  PONV Risk Score and Plan: 2 and Propofol infusion  Airway Management Planned: Natural Airway  Additional Equipment:   Intra-op Plan:   Post-operative Plan:   Informed Consent: I have reviewed the patients History and Physical, chart, labs and discussed the procedure including the risks, benefits and alternatives for the proposed anesthesia with the patient or authorized  representative who has indicated his/her understanding and acceptance.     Dental advisory given  Plan Discussed with: CRNA and Anesthesiologist  Anesthesia Plan Comments:         Anesthesia Quick Evaluation

## 2018-12-26 NOTE — Anesthesia Postprocedure Evaluation (Signed)
Anesthesia Post Note  Patient: Ellen Garcia  Procedure(s) Performed: COLONOSCOPY WITH PROPOFOL (N/A )  Patient location during evaluation: Endoscopy Anesthesia Type: General Level of consciousness: awake and alert and oriented Pain management: pain level controlled Vital Signs Assessment: post-procedure vital signs reviewed and stable Respiratory status: spontaneous breathing, nonlabored ventilation and respiratory function stable Cardiovascular status: blood pressure returned to baseline and stable Postop Assessment: no signs of nausea or vomiting Anesthetic complications: no     Last Vitals:  Vitals:   12/26/18 0905 12/26/18 0923  BP: 127/89 (!) 144/74  Pulse: 70 71  Resp: (!) 23 (!) 24  Temp:    SpO2: 100% 100%    Last Pain:  Vitals:   12/26/18 0923  TempSrc:   PainSc: 0-No pain                 Zakariah Urwin

## 2018-12-27 ENCOUNTER — Encounter: Payer: Self-pay | Admitting: Internal Medicine

## 2019-05-29 DIAGNOSIS — M0579 Rheumatoid arthritis with rheumatoid factor of multiple sites without organ or systems involvement: Secondary | ICD-10-CM | POA: Diagnosis not present

## 2019-09-04 DIAGNOSIS — Z0001 Encounter for general adult medical examination with abnormal findings: Secondary | ICD-10-CM | POA: Diagnosis not present

## 2019-09-04 DIAGNOSIS — M25561 Pain in right knee: Secondary | ICD-10-CM | POA: Diagnosis not present

## 2019-09-04 DIAGNOSIS — E119 Type 2 diabetes mellitus without complications: Secondary | ICD-10-CM | POA: Diagnosis not present

## 2019-09-04 DIAGNOSIS — I10 Essential (primary) hypertension: Secondary | ICD-10-CM | POA: Diagnosis not present

## 2019-09-04 DIAGNOSIS — G8929 Other chronic pain: Secondary | ICD-10-CM | POA: Diagnosis not present

## 2019-09-04 DIAGNOSIS — M0579 Rheumatoid arthritis with rheumatoid factor of multiple sites without organ or systems involvement: Secondary | ICD-10-CM | POA: Diagnosis not present

## 2019-09-11 ENCOUNTER — Other Ambulatory Visit: Payer: Self-pay | Admitting: Internal Medicine

## 2019-09-11 DIAGNOSIS — E0590 Thyrotoxicosis, unspecified without thyrotoxic crisis or storm: Secondary | ICD-10-CM | POA: Diagnosis not present

## 2019-09-11 DIAGNOSIS — M0579 Rheumatoid arthritis with rheumatoid factor of multiple sites without organ or systems involvement: Secondary | ICD-10-CM | POA: Diagnosis not present

## 2019-09-11 DIAGNOSIS — Z0000 Encounter for general adult medical examination without abnormal findings: Secondary | ICD-10-CM | POA: Diagnosis not present

## 2019-09-11 DIAGNOSIS — I10 Essential (primary) hypertension: Secondary | ICD-10-CM | POA: Diagnosis not present

## 2019-09-11 DIAGNOSIS — Z1231 Encounter for screening mammogram for malignant neoplasm of breast: Secondary | ICD-10-CM | POA: Diagnosis not present

## 2019-09-11 DIAGNOSIS — Z0001 Encounter for general adult medical examination with abnormal findings: Secondary | ICD-10-CM | POA: Diagnosis not present

## 2019-09-11 DIAGNOSIS — M0559 Rheumatoid polyneuropathy with rheumatoid arthritis of multiple sites: Secondary | ICD-10-CM | POA: Diagnosis not present

## 2019-09-11 DIAGNOSIS — E119 Type 2 diabetes mellitus without complications: Secondary | ICD-10-CM | POA: Diagnosis not present

## 2019-09-11 DIAGNOSIS — G608 Other hereditary and idiopathic neuropathies: Secondary | ICD-10-CM | POA: Diagnosis not present

## 2019-09-13 ENCOUNTER — Ambulatory Visit
Admission: RE | Admit: 2019-09-13 | Discharge: 2019-09-13 | Disposition: A | Discharge status: Home / Self Care | Payer: Medicare HMO | Source: Ambulatory Visit | Attending: Internal Medicine | Admitting: Internal Medicine

## 2019-09-13 DIAGNOSIS — Z1231 Encounter for screening mammogram for malignant neoplasm of breast: Secondary | ICD-10-CM | POA: Diagnosis not present

## 2019-10-10 DIAGNOSIS — M0579 Rheumatoid arthritis with rheumatoid factor of multiple sites without organ or systems involvement: Secondary | ICD-10-CM | POA: Diagnosis not present

## 2019-10-16 DIAGNOSIS — M0579 Rheumatoid arthritis with rheumatoid factor of multiple sites without organ or systems involvement: Secondary | ICD-10-CM | POA: Diagnosis not present

## 2019-10-16 DIAGNOSIS — M25562 Pain in left knee: Secondary | ICD-10-CM | POA: Diagnosis not present

## 2019-10-16 DIAGNOSIS — G8929 Other chronic pain: Secondary | ICD-10-CM | POA: Diagnosis not present

## 2020-03-05 DIAGNOSIS — E0590 Thyrotoxicosis, unspecified without thyrotoxic crisis or storm: Secondary | ICD-10-CM | POA: Diagnosis not present

## 2020-03-05 DIAGNOSIS — E119 Type 2 diabetes mellitus without complications: Secondary | ICD-10-CM | POA: Diagnosis not present

## 2020-03-12 DIAGNOSIS — E1142 Type 2 diabetes mellitus with diabetic polyneuropathy: Secondary | ICD-10-CM | POA: Diagnosis not present

## 2020-03-12 DIAGNOSIS — E0590 Thyrotoxicosis, unspecified without thyrotoxic crisis or storm: Secondary | ICD-10-CM | POA: Diagnosis not present

## 2020-03-12 DIAGNOSIS — Z6841 Body Mass Index (BMI) 40.0 and over, adult: Secondary | ICD-10-CM | POA: Diagnosis not present

## 2020-03-12 DIAGNOSIS — I10 Essential (primary) hypertension: Secondary | ICD-10-CM | POA: Diagnosis not present

## 2020-03-12 DIAGNOSIS — K219 Gastro-esophageal reflux disease without esophagitis: Secondary | ICD-10-CM | POA: Diagnosis not present

## 2020-04-15 DIAGNOSIS — M0579 Rheumatoid arthritis with rheumatoid factor of multiple sites without organ or systems involvement: Secondary | ICD-10-CM | POA: Diagnosis not present

## 2020-04-15 DIAGNOSIS — M25561 Pain in right knee: Secondary | ICD-10-CM | POA: Diagnosis not present

## 2020-04-15 DIAGNOSIS — R609 Edema, unspecified: Secondary | ICD-10-CM | POA: Diagnosis not present

## 2020-04-15 DIAGNOSIS — G8929 Other chronic pain: Secondary | ICD-10-CM | POA: Diagnosis not present

## 2020-09-04 DIAGNOSIS — M0579 Rheumatoid arthritis with rheumatoid factor of multiple sites without organ or systems involvement: Secondary | ICD-10-CM | POA: Diagnosis not present

## 2020-09-04 DIAGNOSIS — E0590 Thyrotoxicosis, unspecified without thyrotoxic crisis or storm: Secondary | ICD-10-CM | POA: Diagnosis not present

## 2020-09-04 DIAGNOSIS — E119 Type 2 diabetes mellitus without complications: Secondary | ICD-10-CM | POA: Diagnosis not present

## 2020-09-11 DIAGNOSIS — M0579 Rheumatoid arthritis with rheumatoid factor of multiple sites without organ or systems involvement: Secondary | ICD-10-CM | POA: Diagnosis not present

## 2020-09-11 DIAGNOSIS — I10 Essential (primary) hypertension: Secondary | ICD-10-CM | POA: Diagnosis not present

## 2020-09-11 DIAGNOSIS — E1142 Type 2 diabetes mellitus with diabetic polyneuropathy: Secondary | ICD-10-CM | POA: Diagnosis not present

## 2020-09-11 DIAGNOSIS — Z6841 Body Mass Index (BMI) 40.0 and over, adult: Secondary | ICD-10-CM | POA: Diagnosis not present

## 2020-09-11 DIAGNOSIS — Z0001 Encounter for general adult medical examination with abnormal findings: Secondary | ICD-10-CM | POA: Diagnosis not present

## 2020-09-23 DIAGNOSIS — G8929 Other chronic pain: Secondary | ICD-10-CM | POA: Diagnosis not present

## 2020-09-23 DIAGNOSIS — M542 Cervicalgia: Secondary | ICD-10-CM | POA: Diagnosis not present

## 2020-09-23 DIAGNOSIS — M25561 Pain in right knee: Secondary | ICD-10-CM | POA: Diagnosis not present

## 2020-09-23 DIAGNOSIS — M0579 Rheumatoid arthritis with rheumatoid factor of multiple sites without organ or systems involvement: Secondary | ICD-10-CM | POA: Diagnosis not present

## 2021-03-08 IMAGING — MG DIGITAL SCREENING BILAT W/ TOMO W/ CAD
8 of 15 series · 8 of 40 positions shown · non-contrast
Comparison: Previous exam(s).

CLINICAL DATA: Screening.

EXAM:
DIGITAL SCREENING BILATERAL MAMMOGRAM WITH TOMO AND CAD

[L AT synth-2D]
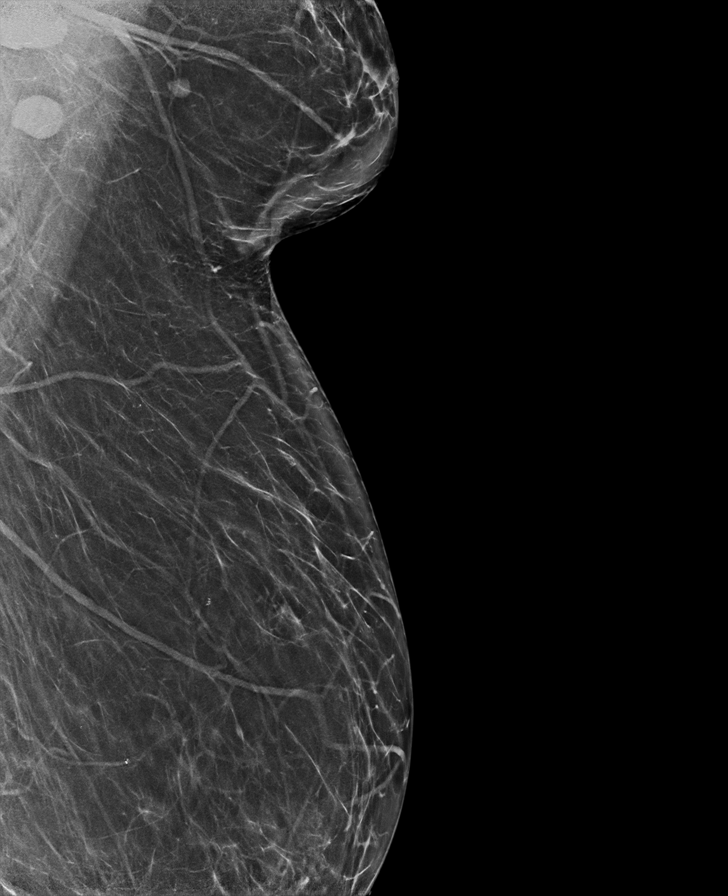

[R CC synth-2D]
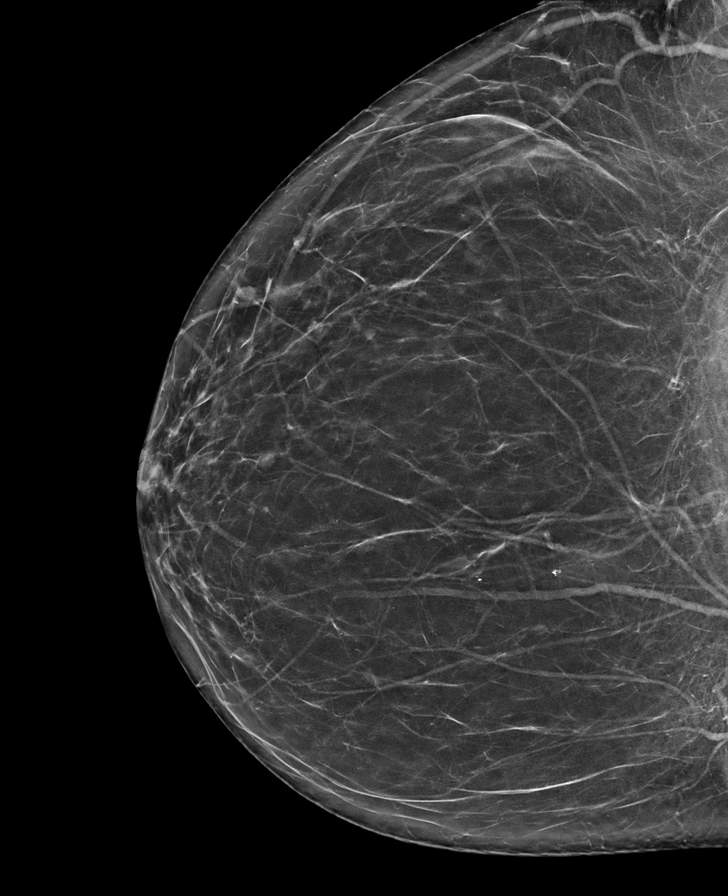

[L CC synth-2D (1 of 2)]
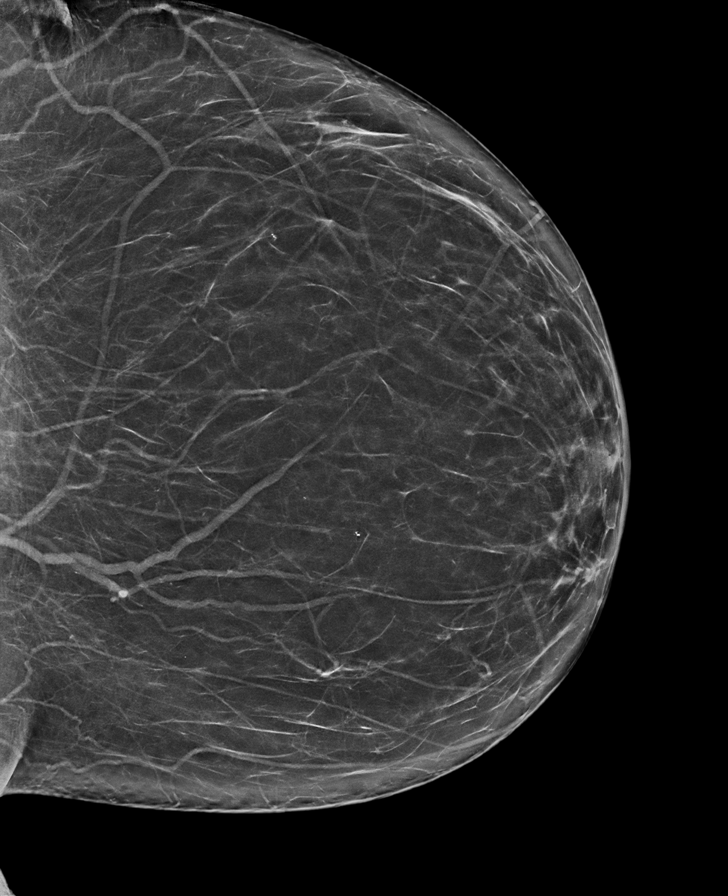

[R AT synth-2D]
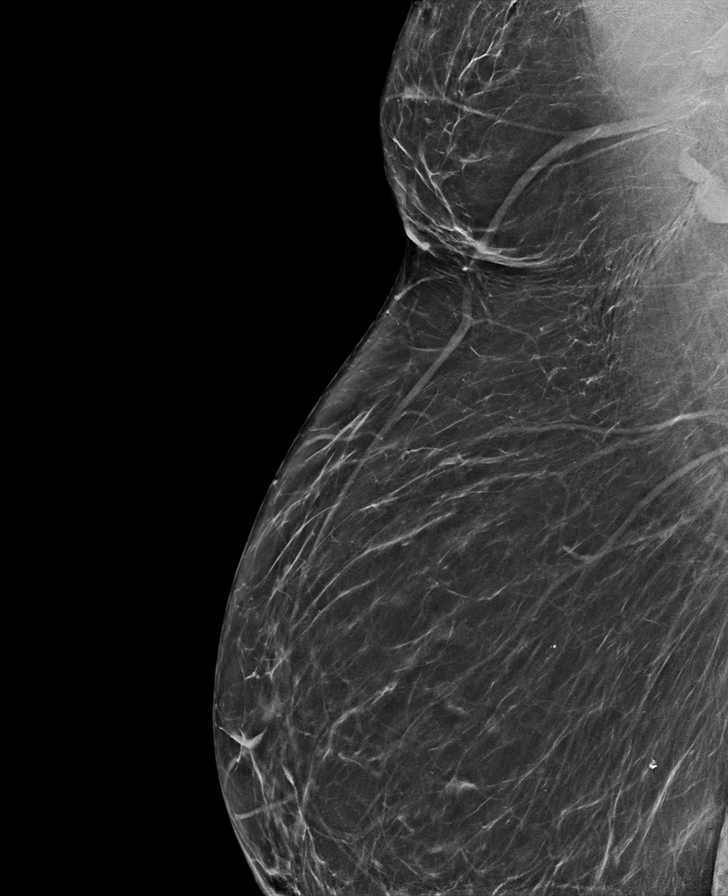

[R MLO synth-2D]
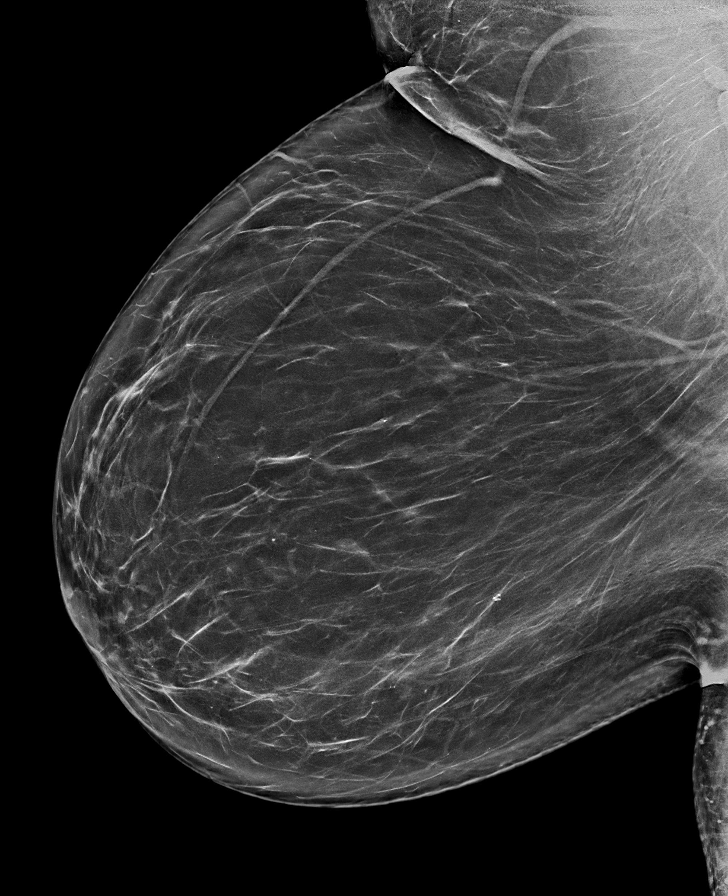

[L CC synth-2D (2 of 2)]
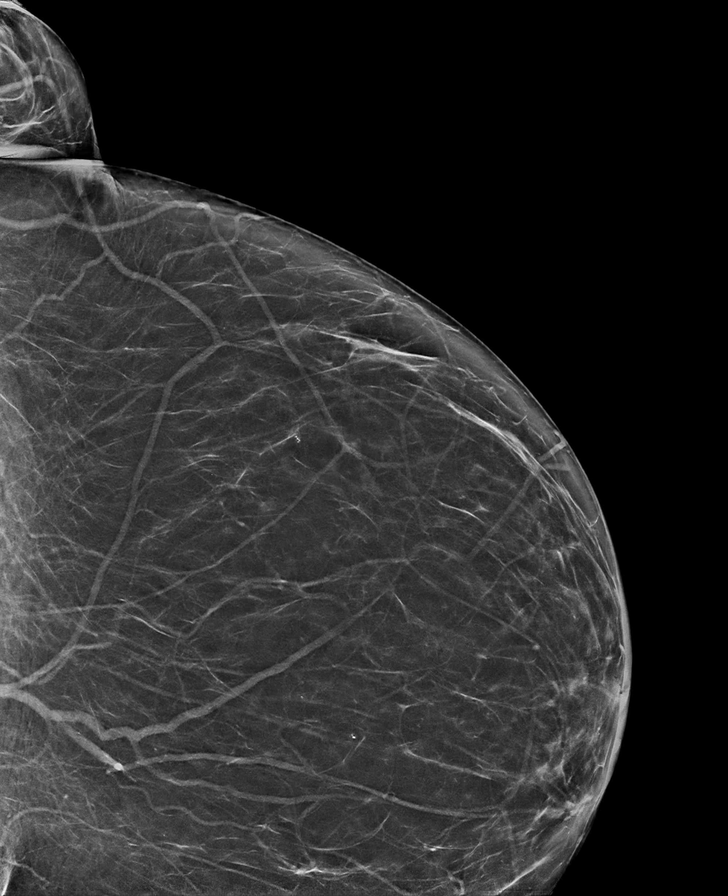

[L MLO synth-2D]
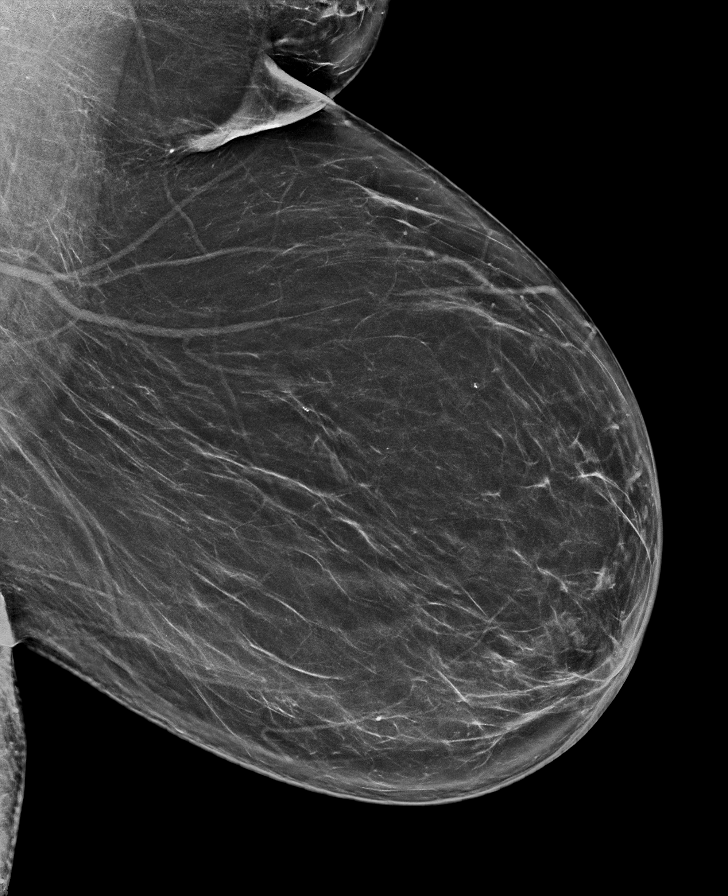

[R AT tomo · tomo slice 57/83.0]
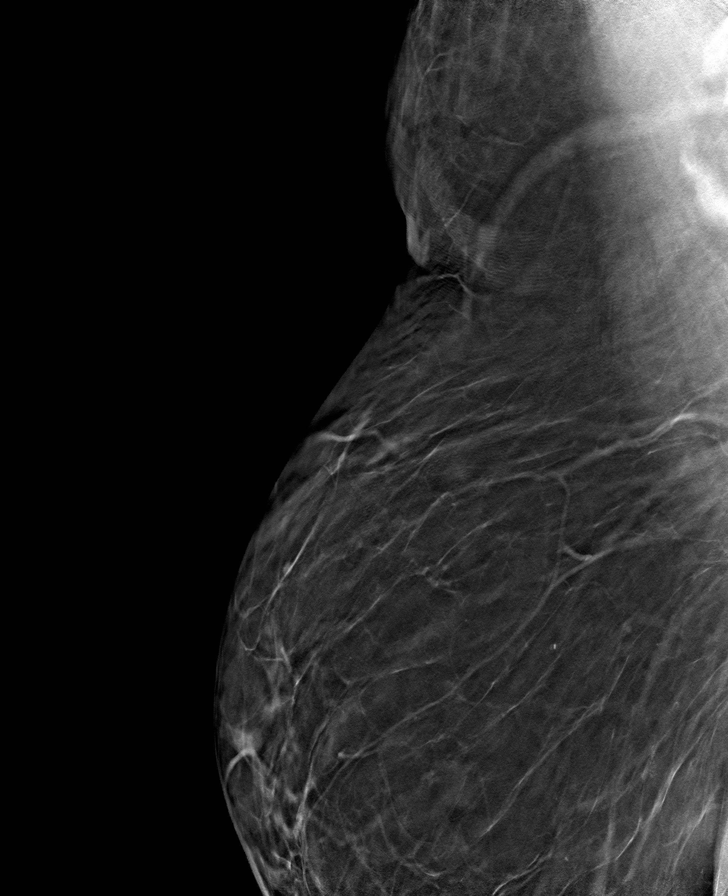

[8 of 40 positions shown; findings below may reference images not displayed]

ACR Breast Density Category b: There are scattered areas of
fibroglandular density.
FINDINGS: There are no findings suspicious for malignancy. Images were
processed with CAD.
IMPRESSION: No mammographic evidence of malignancy. A result letter of this
screening mammogram will be mailed directly to the patient.

RECOMMENDATION:
Screening mammogram in one year. (Code:[id number])

BI-RADS CATEGORY  1: Negative.

## 2021-04-03 ENCOUNTER — Other Ambulatory Visit: Payer: Self-pay | Admitting: Ophthalmology

## 2021-04-03 DIAGNOSIS — H34232 Retinal artery branch occlusion, left eye: Secondary | ICD-10-CM

## 2021-04-15 ENCOUNTER — Ambulatory Visit
Admission: RE | Admit: 2021-04-15 | Discharge: 2021-04-15 | Disposition: A | Discharge status: Home / Self Care | Payer: Medicare Other | Source: Ambulatory Visit | Attending: Ophthalmology | Admitting: Ophthalmology

## 2021-04-15 ENCOUNTER — Other Ambulatory Visit: Payer: Self-pay

## 2021-04-15 DIAGNOSIS — H34232 Retinal artery branch occlusion, left eye: Secondary | ICD-10-CM | POA: Insufficient documentation

## 2021-06-18 DIAGNOSIS — Z79899 Other long term (current) drug therapy: Secondary | ICD-10-CM | POA: Insufficient documentation

## 2021-06-18 DIAGNOSIS — E1142 Type 2 diabetes mellitus with diabetic polyneuropathy: Secondary | ICD-10-CM | POA: Insufficient documentation

## 2021-09-09 ENCOUNTER — Ambulatory Visit: Payer: Medicare HMO | Admitting: Dermatology

## 2021-09-14 ENCOUNTER — Other Ambulatory Visit: Payer: Self-pay | Admitting: Internal Medicine

## 2021-09-14 DIAGNOSIS — Z1231 Encounter for screening mammogram for malignant neoplasm of breast: Secondary | ICD-10-CM

## 2021-09-22 ENCOUNTER — Ambulatory Visit: Payer: Self-pay | Admitting: Dermatology

## 2021-10-07 ENCOUNTER — Ambulatory Visit
Admission: RE | Admit: 2021-10-07 | Discharge: 2021-10-07 | Disposition: A | Discharge status: Home / Self Care | Payer: Medicare Other | Source: Ambulatory Visit | Attending: Internal Medicine | Admitting: Internal Medicine

## 2021-10-07 DIAGNOSIS — Z1231 Encounter for screening mammogram for malignant neoplasm of breast: Secondary | ICD-10-CM | POA: Diagnosis present

## 2021-11-16 ENCOUNTER — Ambulatory Visit: Payer: Self-pay | Admitting: Dermatology

## 2022-01-08 ENCOUNTER — Other Ambulatory Visit
Admission: RE | Admit: 2022-01-08 | Discharge: 2022-01-08 | Disposition: A | Discharge status: Home / Self Care | Payer: Medicare Other | Source: Ambulatory Visit | Attending: Orthopedic Surgery | Admitting: Orthopedic Surgery

## 2022-01-08 DIAGNOSIS — T84033A Mechanical loosening of internal left knee prosthetic joint, initial encounter: Secondary | ICD-10-CM | POA: Diagnosis present

## 2022-01-08 LAB — SYNOVIAL CELL COUNT + DIFF, W/ CRYSTALS
Crystals, Fluid: NONE SEEN
Eosinophils-Synovial: 0 %
Lymphocytes-Synovial Fld: 70 %
Monocyte-Macrophage-Synovial Fluid: 26 %
Neutrophil, Synovial: 4 %
WBC, Synovial: 259 /mm3 — ABNORMAL HIGH (ref 0–200)

## 2022-01-11 ENCOUNTER — Other Ambulatory Visit: Payer: Self-pay | Admitting: Orthopedic Surgery

## 2022-01-11 DIAGNOSIS — T84033A Mechanical loosening of internal left knee prosthetic joint, initial encounter: Secondary | ICD-10-CM

## 2022-01-12 LAB — BODY FLUID CULTURE W GRAM STAIN
Culture: NO GROWTH
Gram Stain: NONE SEEN

## 2022-01-13 ENCOUNTER — Ambulatory Visit
Admission: RE | Admit: 2022-01-13 | Discharge: 2022-01-13 | Disposition: A | Discharge status: Home / Self Care | Payer: Medicare Other | Source: Ambulatory Visit | Attending: Orthopedic Surgery | Admitting: Orthopedic Surgery

## 2022-01-13 DIAGNOSIS — T84033A Mechanical loosening of internal left knee prosthetic joint, initial encounter: Secondary | ICD-10-CM

## 2022-01-28 ENCOUNTER — Other Ambulatory Visit: Payer: Self-pay | Admitting: Orthopedic Surgery

## 2022-01-28 ENCOUNTER — Encounter
Admission: RE | Admit: 2022-01-28 | Discharge: 2022-01-28 | Disposition: A | Discharge status: Home / Self Care | Payer: Medicare Other | Source: Ambulatory Visit | Attending: Orthopedic Surgery | Admitting: Orthopedic Surgery

## 2022-01-28 VITALS — BP 149/76 | HR 77 | Resp 14 | Ht 67.0 in | Wt 311.1 lb

## 2022-01-28 DIAGNOSIS — I10 Essential (primary) hypertension: Secondary | ICD-10-CM | POA: Insufficient documentation

## 2022-01-28 DIAGNOSIS — M1712 Unilateral primary osteoarthritis, left knee: Secondary | ICD-10-CM | POA: Insufficient documentation

## 2022-01-28 DIAGNOSIS — Z01818 Encounter for other preprocedural examination: Secondary | ICD-10-CM | POA: Insufficient documentation

## 2022-01-28 DIAGNOSIS — R8290 Unspecified abnormal findings in urine: Secondary | ICD-10-CM | POA: Diagnosis not present

## 2022-01-28 DIAGNOSIS — Z01812 Encounter for preprocedural laboratory examination: Secondary | ICD-10-CM

## 2022-01-28 DIAGNOSIS — E119 Type 2 diabetes mellitus without complications: Secondary | ICD-10-CM | POA: Diagnosis not present

## 2022-01-28 HISTORY — DX: Diverticulosis of intestine, part unspecified, without perforation or abscess without bleeding: K57.90

## 2022-01-28 HISTORY — DX: Type 2 diabetes mellitus without complications: E11.9

## 2022-01-28 LAB — TYPE AND SCREEN
ABO/RH(D): O NEG
Antibody Screen: NEGATIVE

## 2022-01-28 LAB — CBC
HCT: 36.1 % (ref 36.0–46.0)
Hemoglobin: 11.3 g/dL — ABNORMAL LOW (ref 12.0–15.0)
MCH: 28.2 pg (ref 26.0–34.0)
MCHC: 31.3 g/dL (ref 30.0–36.0)
MCV: 90 fL (ref 80.0–100.0)
Platelets: 257 10*3/uL (ref 150–400)
RBC: 4.01 MIL/uL (ref 3.87–5.11)
RDW: 13.2 % (ref 11.5–15.5)
WBC: 6.4 10*3/uL (ref 4.0–10.5)
nRBC: 0 % (ref 0.0–0.2)

## 2022-01-28 LAB — URINALYSIS, ROUTINE W REFLEX MICROSCOPIC
Bilirubin Urine: NEGATIVE
Glucose, UA: NEGATIVE mg/dL
Hgb urine dipstick: NEGATIVE
Ketones, ur: NEGATIVE mg/dL
Nitrite: NEGATIVE
Protein, ur: NEGATIVE mg/dL
Specific Gravity, Urine: 1.023 (ref 1.005–1.030)
pH: 5 (ref 5.0–8.0)

## 2022-01-28 LAB — SURGICAL PCR SCREEN
MRSA, PCR: NEGATIVE
Staphylococcus aureus: NEGATIVE

## 2022-01-28 NOTE — Patient Instructions (Addendum)
Your procedure is scheduled on:02-08-22 Monday Report to the Registration Desk on the 1st floor of the [facility].Then proceed to the 2nd floor Surgery Desk To find out your arrival time, please call [phone] between 1PM - 3PM on:02-05-22 Friday If your arrival time is 6:00 am, do not arrive prior to that time as the [facility] entrance doors do not open until 6:00 am.  REMEMBER: Instructions that are not followed completely may result in serious medical risk, up to and including death; or upon the discretion of your surgeon and anesthesiologist your surgery may need to be rescheduled.  Do not eat food after midnight the night before surgery.  No gum chewing, lozengers or hard candies.  You may however, drink Water up to 2 hours before you are scheduled to arrive for your surgery. Do not drink anything within 2 hours of your scheduled arrival time  Type 1 and Type 2 diabetics should only drink water.  In addition, your doctor has ordered for you to drink the provided  Gatorade G2 Drinking this carbohydrate drink up to two hours before surgery helps to reduce insulin resistance and improve patient outcomes. Please complete drinking 2 hours prior to scheduled arrival time.  TAKE THESE MEDICATIONS THE MORNING OF SURGERY WITH A SIP OF WATER: -amLODipine (NORVASC)  -gabapentin (NEURONTIN)   One week prior to surgery: Stop Anti-inflammatories (NSAIDS) such as Advil, Aleve, Ibuprofen, Motrin, Naproxen, Naprosyn and Aspirin based products such as Excedrin, Goodys Powder, BC Powder.You may however, take Tylenol if needed for pain up until the day of surgery.  Stop ANY OVER THE COUNTER supplements/vitamins 7 days prior to surgery (multivitamin, green tea, biotin)  No Alcohol for 24 hours before or after surgery.  No Smoking including e-cigarettes for 24 hours prior to surgery.  No chewable tobacco products for at least 6 hours prior to surgery.  No nicotine patches on the day of  surgery.  Do not use any "recreational" drugs for at least a week prior to your surgery.  Please be advised that the combination of cocaine and anesthesia may have negative outcomes, up to and including death. If you test positive for cocaine, your surgery will be cancelled.  On the morning of surgery brush your teeth with toothpaste and water, you may rinse your mouth with mouthwash if you wish. Do not swallow any toothpaste or mouthwash.  Use CHG Soap as directed on instruction sheet.  Do not wear jewelry, make-up, hairpins, clips or nail polish.  Do not wear lotions, powders, or perfumes.   Do not shave body from the neck down 48 hours prior to surgery just in case you cut yourself which could leave a site for infection.  Also, freshly shaved skin may become irritated if using the CHG soap.  Contact lenses, hearing aids and dentures may not be worn into surgery.  Do not bring valuables to the hospital. [facility] is not responsible for any missing/lost belongings or valuables.   Notify your doctor if there is any change in your medical condition (cold, fever, infection).  Wear comfortable clothing (specific to your surgery type) to the hospital.  After surgery, you can help prevent lung complications by doing breathing exercises.  Take deep breaths and cough every 1-2 hours. Your doctor may order a device called an Incentive Spirometer to help you take deep breaths. When coughing or sneezing, hold a pillow firmly against your incision with both hands. This is called "splinting." Doing this helps protect your incision. It  also decreases belly discomfort.  If you are being admitted to the hospital overnight, leave your suitcase in the car. After surgery it may be brought to your room.  If you are being discharged the day of surgery, you will not be allowed to drive home. You will need a responsible adult (18 years or older) to drive you home and stay with you that night.   If  you are taking public transportation, you will need to have a responsible adult (18 years or older) with you. Please confirm with your physician that it is acceptable to use public transportation.   Please call the [facility] at [phone] if you have any questions about these instructions.  Surgery Visitation Policy:  Patients undergoing a surgery or procedure may have two family members or support persons with them as long as the person is not COVID-19 positive or experiencing its symptoms.   Inpatient Visitation:    Visiting hours are 7 a.m. to 8 p.m. Up to four visitors are allowed at one time in a patient room, including children. The visitors may rotate out with other people during the day. One designated support person (adult) may remain overnight.    How to Use an Incentive Spirometer An incentive spirometer is a tool that measures how well you are filling your lungs with each breath. Learning to take long, deep breaths using this tool can help you keep your lungs clear and active. This may help to reverse or lessen your chance of developing breathing (pulmonary) problems, especially infection. You may be asked to use a spirometer: After a surgery. If you have a lung problem or a history of smoking. After a long period of time when you have been unable to move or be active. If the spirometer includes an indicator to show the highest number that you have reached, your health care provider or respiratory therapist will help you set a goal. Keep a log of your progress as told by your health care provider. What are the risks? Breathing too quickly may cause dizziness or cause you to pass out. Take your time so you do not get dizzy or light-headed. If you are in pain, you may need to take pain medicine before doing incentive spirometry. It is harder to take a deep breath if you are having pain. How to use your incentive spirometer  Sit up on the edge of your bed or  on a chair. Hold the incentive spirometer so that it is in an upright position. Before you use the spirometer, breathe out normally. Place the mouthpiece in your mouth. Make sure your lips are closed tightly around it. Breathe in slowly and as deeply as you can through your mouth, causing the piston or the ball to rise toward the top of the chamber. Hold your breath for 3-5 seconds, or for as long as possible. If the spirometer includes a coach indicator, use this to guide you in breathing. Slow down your breathing if the indicator goes above the marked areas. Remove the mouthpiece from your mouth and breathe out normally. The piston or ball will return to the bottom of the chamber. Rest for a few seconds, then repeat the steps 10 or more times. Take your time and take a few normal breaths between deep breaths so that you do not get dizzy or light-headed. Do this every 1-2 hours when you are awake. If the spirometer includes a goal marker to show the highest number you have reached (  best effort), use this as a goal to work toward during each repetition. After each set of 10 deep breaths, cough a few times. This will help to make sure that your lungs are clear. If you have an incision on your chest or abdomen from surgery, place a pillow or a rolled-up towel firmly against the incision when you cough. This can help to reduce pain while taking deep breaths and coughing. General tips When you are able to get out of bed: Walk around often. Continue to take deep breaths and cough in order to clear your lungs. Keep using the incentive spirometer until your health care provider says it is okay to stop using it. If you have been in the hospital, you may be told to keep using the spirometer at home. Contact a health care provider if: You are having difficulty using the spirometer. You have trouble using the spirometer as often as instructed. Your pain medicine is not giving enough relief for you to use  the spirometer as told. You have a fever. Get help right away if: You develop shortness of breath. You develop a cough with bloody mucus from the lungs. You have fluid or blood coming from an incision site after you cough. Summary An incentive spirometer is a tool that can help you learn to take long, deep breaths to keep your lungs clear and active. You may be asked to use a spirometer after a surgery, if you have a lung problem or a history of smoking, or if you have been inactive for a long period of time. Use your incentive spirometer as instructed every 1-2 hours while you are awake. If you have an incision on your chest or abdomen, place a pillow or a rolled-up towel firmly against your incision when you cough. This will help to reduce pain. Get help right away if you have shortness of breath, you cough up bloody mucus, or blood comes from your incision when you cough. This information is not intended to replace advice given to you by your health care provider. Make sure you discuss any questions you have with your health care provider. Document Revised: 06/11/2019 Document Reviewed: 06/11/2019 Elsevier Patient Education  [place].

## 2022-01-31 LAB — URINE CULTURE: Culture: >=100000 — AB

## 2022-02-01 ENCOUNTER — Telehealth: Payer: Self-pay | Admitting: Urgent Care

## 2022-02-01 DIAGNOSIS — Z01812 Encounter for preprocedural laboratory examination: Secondary | ICD-10-CM

## 2022-02-01 DIAGNOSIS — B9620 Unspecified Escherichia coli [E. coli] as the cause of diseases classified elsewhere: Secondary | ICD-10-CM

## 2022-02-01 MED ORDER — NITROFURANTOIN MONOHYD MACRO 100 MG PO CAPS: 100.0000 mg | ORAL_CAPSULE | Freq: Two times a day (BID) | ORAL | 0 refills | Status: AC

## 2022-02-01 NOTE — Progress Notes (Signed)
[facility] Perioperative Services: Pre-Admission/Anesthesia Testing  Abnormal Lab Notification and Treatment Plan of Care   Date: 02/01/22  Name: Ellen Garcia MRN:   [id number]  Re: Abnormal labs noted during PAT appointment   Notified:  Provider Name Provider Role Notification Mode  [name], MD Orthopedics (Surgeon)  Routed and/or faxed via [facility]   Abnormal Lab Value(s):   Lab Results  Component Value Date   COLORURINE YELLOW (A) 01/28/2022   APPEARANCEUR HAZY (A) 01/28/2022   LABSPEC 1.023 01/28/2022   PHURINE 5.0 01/28/2022   GLUCOSEU NEGATIVE 01/28/2022   HGBUR NEGATIVE 01/28/2022   BILIRUBINUR NEGATIVE 01/28/2022   KETONESUR NEGATIVE 01/28/2022   PROTEINUR NEGATIVE 01/28/2022   NITRITE NEGATIVE 01/28/2022   LEUKOCYTESUR MODERATE (A) 01/28/2022   EPIU 0-5 01/28/2022   WBCU 11-20 01/28/2022   RBCU 0-5 01/28/2022   BACTERIA MANY (A) 01/28/2022   CULT >=100,000 COLONIES/mL ESCHERICHIA COLI (A) 01/28/2022    Clinical Information and Notes:  Patient is scheduled for an elective LEFT TOTAL KNEE REVISION, BOTH COMPONENTS on 02/08/2022.    UA performed in PAT consistent with/concerning for infection.  No leukocytosis noted on CBC; WBC 6400 Renal function: BUN 20 and creatinine 0.8 mg/dL on 01/08/2022 (eGFR 81) Urine C&S added to assess for pathogenically significant growth.  Impression and Plan:  Hildred Laser with a UA that was (+) for infection; reflex culture sent. Culture (+) for (+) significant GNR colony count; pathogen was identified as Escherichia coli. Contacted patient to discuss. Patient reporting that she is experiencing minor LUTS, mainly frequency and a bit of urgency. She denies any nausea, vomiting, fevers, or abdominal/suprapubic/back pain. Patient with surgery scheduled soon. In efforts to avoid delaying patient's procedure, or have her experience any potentially significant perioperative complications related to the  aforementioned, I would like to proceed with empiric treatment for urinary tract infection.  Allergies reviewed. Allergic to PCN only. Culture report also reviewed to ensure culture appropriate coverage is being provided; resistant to PCN and SMZ-TMP.  Will treat with a 5 day course of NITROFURANTOIN. Patient encouraged to complete the entire course of antibiotics even if she begins to feel better. She was advised that if culture demonstrates resistance to the prescribed antibiotic, she will be contacted and advised of the need to change the antibiotic being used to treat her infection.   Meds ordered this encounter  Medications   nitrofurantoin, macrocrystal-monohydrate, (MACROBID) 100 MG capsule    Sig: Take 1 capsule (100 mg total) by mouth 2 (two) times daily for 5 days. Increase WATER intake while taking this medication.    Dispense:  10 capsule    Refill:  0   Patient encouraged to increase her fluid intake as much as possible. Discussed that water is always best to flush the urinary tract. She was advised to avoid caffeine containing fluids until her infections clears, as caffeine can cause her to experience painful bladder spasms.   May use Tylenol as needed for pain/fever should she experience these symptoms.   Patient instructed to call surgeon's office or PAT with any questions or concerns related to the above outlined course of treatment. Additionally, she was instructed to call if she feels like she is getting worse overall while on treatment. Results and treatment plan of care forwarded to primary attending surgeon to make them aware.   Encounter Diagnoses  Name Primary?   Pre-operative laboratory examination Yes   E. coli UTI (urinary tract infection)    Honor Loh,  MSN, APRN, FNP-C, [place]  Peri-operative Services Nurse Practitioner Phone: [phone] Fax: [phone] 02/01/22 8:46 AM  NOTE: This note has been prepared using Dragon  dictation software. Despite my best ability to proofread, there is always the potential that unintentional transcriptional errors may still occur from this process.

## 2022-02-08 ENCOUNTER — Inpatient Hospital Stay: Payer: Medicare Other

## 2022-02-08 ENCOUNTER — Inpatient Hospital Stay: Payer: Medicare Other | Admitting: Urgent Care

## 2022-02-08 ENCOUNTER — Inpatient Hospital Stay
Admission: RE | Admit: 2022-02-08 | Discharge: 2022-02-11 | DRG: 467 | Disposition: A | Discharge status: Home Health | Payer: Medicare Other | Attending: Orthopedic Surgery | Admitting: Orthopedic Surgery

## 2022-02-08 ENCOUNTER — Other Ambulatory Visit: Payer: Self-pay

## 2022-02-08 ENCOUNTER — Encounter
Admission: RE | Disposition: A | Discharge status: Home Health | Payer: Self-pay | Source: Home / Self Care | Attending: Orthopedic Surgery

## 2022-02-08 ENCOUNTER — Encounter: Payer: Self-pay | Admitting: *Deleted

## 2022-02-08 DIAGNOSIS — Z79620 Long term (current) use of immunosuppressive biologic: Secondary | ICD-10-CM

## 2022-02-08 DIAGNOSIS — Z8249 Family history of ischemic heart disease and other diseases of the circulatory system: Secondary | ICD-10-CM | POA: Diagnosis not present

## 2022-02-08 DIAGNOSIS — Z79899 Other long term (current) drug therapy: Secondary | ICD-10-CM | POA: Diagnosis not present

## 2022-02-08 DIAGNOSIS — I10 Essential (primary) hypertension: Secondary | ICD-10-CM | POA: Diagnosis present

## 2022-02-08 DIAGNOSIS — Z880 Allergy status to penicillin: Secondary | ICD-10-CM

## 2022-02-08 DIAGNOSIS — Y792 Prosthetic and other implants, materials and accessory orthopedic devices associated with adverse incidents: Secondary | ICD-10-CM | POA: Diagnosis present

## 2022-02-08 DIAGNOSIS — T84033A Mechanical loosening of internal left knee prosthetic joint, initial encounter: Secondary | ICD-10-CM | POA: Diagnosis present

## 2022-02-08 DIAGNOSIS — Y831 Surgical operation with implant of artificial internal device as the cause of abnormal reaction of the patient, or of later complication, without mention of misadventure at the time of the procedure: Secondary | ICD-10-CM | POA: Diagnosis present

## 2022-02-08 DIAGNOSIS — D62 Acute posthemorrhagic anemia: Secondary | ICD-10-CM | POA: Diagnosis not present

## 2022-02-08 DIAGNOSIS — G629 Polyneuropathy, unspecified: Secondary | ICD-10-CM | POA: Diagnosis present

## 2022-02-08 DIAGNOSIS — Z7984 Long term (current) use of oral hypoglycemic drugs: Secondary | ICD-10-CM

## 2022-02-08 DIAGNOSIS — E119 Type 2 diabetes mellitus without complications: Secondary | ICD-10-CM | POA: Diagnosis present

## 2022-02-08 DIAGNOSIS — Z888 Allergy status to other drugs, medicaments and biological substances status: Secondary | ICD-10-CM | POA: Diagnosis not present

## 2022-02-08 DIAGNOSIS — Z96652 Presence of left artificial knee joint: Secondary | ICD-10-CM | POA: Diagnosis present

## 2022-02-08 DIAGNOSIS — Z01818 Encounter for other preprocedural examination: Secondary | ICD-10-CM

## 2022-02-08 DIAGNOSIS — M069 Rheumatoid arthritis, unspecified: Secondary | ICD-10-CM | POA: Diagnosis present

## 2022-02-08 DIAGNOSIS — Z6841 Body Mass Index (BMI) 40.0 and over, adult: Secondary | ICD-10-CM

## 2022-02-08 HISTORY — PX: TOTAL KNEE REVISION: SHX996

## 2022-02-08 LAB — HEMOGLOBIN A1C
Hgb A1c MFr Bld: 7.6 % — ABNORMAL HIGH (ref 4.8–5.6)
Mean Plasma Glucose: 171.42 mg/dL

## 2022-02-08 LAB — GLUCOSE, CAPILLARY
Glucose-Capillary: 157 mg/dL — ABNORMAL HIGH (ref 70–99)
Glucose-Capillary: 186 mg/dL — ABNORMAL HIGH (ref 70–99)
Glucose-Capillary: 224 mg/dL — ABNORMAL HIGH (ref 70–99)

## 2022-02-08 SURGERY — TOTAL KNEE REVISION
Anesthesia: Spinal | Site: Knee | Laterality: Left

## 2022-02-08 MED ORDER — FENTANYL CITRATE (PF) 100 MCG/2ML IJ SOLN
INTRAMUSCULAR | Status: DC | PRN
  Administered 2022-02-08 (×2): 50 ug via INTRAVENOUS

## 2022-02-08 MED ORDER — DEXAMETHASONE SODIUM PHOSPHATE 10 MG/ML IJ SOLN
INTRAMUSCULAR | Status: AC
  Filled 2022-02-08: qty 1

## 2022-02-08 MED ORDER — CEFAZOLIN IN SODIUM CHLORIDE 3-0.9 GM/100ML-% IV SOLN
3.0000 g | Freq: Four times a day (QID) | INTRAVENOUS | Status: AC
  Administered 2022-02-08 (×2): 3 g via INTRAVENOUS
  Filled 2022-02-08 (×2): qty 100

## 2022-02-08 MED ORDER — ONDANSETRON HCL 4 MG/2ML IJ SOLN: 4.0000 mg | Freq: Four times a day (QID) | INTRAMUSCULAR | Status: DC | PRN

## 2022-02-08 MED ORDER — BUPIVACAINE-EPINEPHRINE (PF) 0.25% -1:200000 IJ SOLN
INTRAMUSCULAR | Status: AC
  Filled 2022-02-08: qty 30

## 2022-02-08 MED ORDER — TRANEXAMIC ACID-NACL 1000-0.7 MG/100ML-% IV SOLN
1000.0000 mg | INTRAVENOUS | Status: AC
  Administered 2022-02-08 (×2): 1000 mg via INTRAVENOUS

## 2022-02-08 MED ORDER — SODIUM CHLORIDE (PF) 0.9 % IJ SOLN
INTRAMUSCULAR | Status: DC | PRN
  Administered 2022-02-08: 51 mL via SURGICAL_CAVITY

## 2022-02-08 MED ORDER — CHLORHEXIDINE GLUCONATE 0.12 % MT SOLN
OROMUCOSAL | Status: AC
  Administered 2022-02-08: 15 mL via OROMUCOSAL
  Filled 2022-02-08: qty 15

## 2022-02-08 MED ORDER — EPHEDRINE 5 MG/ML INJ
INTRAVENOUS | Status: AC
  Filled 2022-02-08: qty 5

## 2022-02-08 MED ORDER — ONDANSETRON HCL 4 MG/2ML IJ SOLN
INTRAMUSCULAR | Status: DC | PRN
  Administered 2022-02-08: 4 mg via INTRAVENOUS

## 2022-02-08 MED ORDER — GABAPENTIN 300 MG PO CAPS
300.0000 mg | ORAL_CAPSULE | Freq: Three times a day (TID) | ORAL | Status: DC
  Administered 2022-02-08 – 2022-02-11 (×10): 300 mg via ORAL
  Filled 2022-02-08 (×10): qty 1

## 2022-02-08 MED ORDER — ORAL CARE MOUTH RINSE: 15.0000 mL | Freq: Once | OROMUCOSAL | Status: AC

## 2022-02-08 MED ORDER — DEXAMETHASONE SODIUM PHOSPHATE 10 MG/ML IJ SOLN
INTRAMUSCULAR | Status: DC | PRN
  Administered 2022-02-08: 5 mg via INTRAVENOUS

## 2022-02-08 MED ORDER — ACETAMINOPHEN 10 MG/ML IV SOLN
INTRAVENOUS | Status: AC
  Filled 2022-02-08: qty 100

## 2022-02-08 MED ORDER — KETOROLAC TROMETHAMINE 15 MG/ML IJ SOLN
7.5000 mg | Freq: Four times a day (QID) | INTRAMUSCULAR | Status: AC
  Administered 2022-02-08 – 2022-02-09 (×3): 7.5 mg via INTRAVENOUS
  Filled 2022-02-08 (×3): qty 1

## 2022-02-08 MED ORDER — PROPOFOL 1000 MG/100ML IV EMUL
INTRAVENOUS | Status: AC
  Filled 2022-02-08: qty 100

## 2022-02-08 MED ORDER — TRANEXAMIC ACID 1000 MG/10ML IV SOLN
INTRAVENOUS | Status: AC
  Filled 2022-02-08: qty 10

## 2022-02-08 MED ORDER — CHLORHEXIDINE GLUCONATE 0.12 % MT SOLN: 15.0000 mL | Freq: Once | OROMUCOSAL | Status: AC

## 2022-02-08 MED ORDER — ONDANSETRON HCL 4 MG PO TABS: 4.0000 mg | ORAL_TABLET | Freq: Four times a day (QID) | ORAL | Status: DC | PRN

## 2022-02-08 MED ORDER — INSULIN ASPART 100 UNIT/ML IJ SOLN
0.0000 [IU] | Freq: Every day | INTRAMUSCULAR | Status: DC
  Administered 2022-02-08: 2 [IU] via SUBCUTANEOUS
  Filled 2022-02-08: qty 1

## 2022-02-08 MED ORDER — ACETAMINOPHEN 500 MG PO TABS
1000.0000 mg | ORAL_TABLET | Freq: Three times a day (TID) | ORAL | Status: AC
  Administered 2022-02-08 – 2022-02-09 (×3): 1000 mg via ORAL
  Filled 2022-02-08 (×3): qty 2

## 2022-02-08 MED ORDER — ACETAMINOPHEN 10 MG/ML IV SOLN
INTRAVENOUS | Status: DC | PRN
  Administered 2022-02-08: 1000 mg via INTRAVENOUS

## 2022-02-08 MED ORDER — AMLODIPINE BESYLATE 5 MG PO TABS: 5.0000 mg | ORAL_TABLET | Freq: Every day | ORAL | Status: DC

## 2022-02-08 MED ORDER — SODIUM CHLORIDE 0.9 % IV SOLN: INTRAVENOUS | Status: DC

## 2022-02-08 MED ORDER — PHENOL 1.4 % MT LIQD: 1.0000 | OROMUCOSAL | Status: DC | PRN

## 2022-02-08 MED ORDER — INSULIN ASPART 100 UNIT/ML IJ SOLN
0.0000 [IU] | Freq: Three times a day (TID) | INTRAMUSCULAR | Status: DC
  Administered 2022-02-08: 5 [IU] via SUBCUTANEOUS
  Administered 2022-02-09 (×2): 2 [IU] via SUBCUTANEOUS
  Administered 2022-02-10 (×3): 3 [IU] via SUBCUTANEOUS
  Administered 2022-02-11: 5 [IU] via SUBCUTANEOUS
  Administered 2022-02-11 (×2): 2 [IU] via SUBCUTANEOUS
  Filled 2022-02-08 (×9): qty 1

## 2022-02-08 MED ORDER — PHENYLEPHRINE HCL-NACL 20-0.9 MG/250ML-% IV SOLN
INTRAVENOUS | Status: AC
  Filled 2022-02-08: qty 250

## 2022-02-08 MED ORDER — ENOXAPARIN SODIUM 30 MG/0.3ML IJ SOSY
30.0000 mg | PREFILLED_SYRINGE | Freq: Two times a day (BID) | INTRAMUSCULAR | Status: DC
  Administered 2022-02-09 – 2022-02-11 (×5): 30 mg via SUBCUTANEOUS
  Filled 2022-02-08 (×5): qty 0.3

## 2022-02-08 MED ORDER — CEFAZOLIN IN SODIUM CHLORIDE 3-0.9 GM/100ML-% IV SOLN
3.0000 g | INTRAVENOUS | Status: AC
  Administered 2022-02-08: 3 g via INTRAVENOUS
  Filled 2022-02-08: qty 100

## 2022-02-08 MED ORDER — PHENYLEPHRINE HCL (PRESSORS) 10 MG/ML IV SOLN
INTRAVENOUS | Status: DC | PRN
  Administered 2022-02-08: 80 ug via INTRAVENOUS

## 2022-02-08 MED ORDER — VANCOMYCIN HCL 1000 MG IV SOLR
INTRAVENOUS | Status: AC
  Filled 2022-02-08: qty 40

## 2022-02-08 MED ORDER — ONDANSETRON HCL 4 MG/2ML IJ SOLN
INTRAMUSCULAR | Status: AC
  Filled 2022-02-08: qty 2

## 2022-02-08 MED ORDER — EPHEDRINE SULFATE (PRESSORS) 50 MG/ML IJ SOLN
INTRAMUSCULAR | Status: DC | PRN
  Administered 2022-02-08: 10 mg via INTRAVENOUS

## 2022-02-08 MED ORDER — FENTANYL CITRATE (PF) 100 MCG/2ML IJ SOLN
INTRAMUSCULAR | Status: AC
  Filled 2022-02-08: qty 2

## 2022-02-08 MED ORDER — 0.9 % SODIUM CHLORIDE (POUR BTL) OPTIME
TOPICAL | Status: DC | PRN
  Administered 2022-02-08: 1000 mL

## 2022-02-08 MED ORDER — TRANEXAMIC ACID-NACL 1000-0.7 MG/100ML-% IV SOLN
INTRAVENOUS | Status: AC
  Filled 2022-02-08: qty 100

## 2022-02-08 MED ORDER — HYDROCODONE-ACETAMINOPHEN 5-325 MG PO TABS
1.0000 | ORAL_TABLET | ORAL | Status: DC | PRN
  Administered 2022-02-08: 2 via ORAL
  Administered 2022-02-09 – 2022-02-10 (×6): 1 via ORAL
  Administered 2022-02-11 (×2): 2 via ORAL
  Filled 2022-02-08: qty 1
  Filled 2022-02-08 (×2): qty 2
  Filled 2022-02-08: qty 1
  Filled 2022-02-08: qty 2
  Filled 2022-02-08 (×3): qty 1
  Filled 2022-02-08: qty 2
  Filled 2022-02-08: qty 1

## 2022-02-08 MED ORDER — PHENYLEPHRINE HCL-NACL 20-0.9 MG/250ML-% IV SOLN
INTRAVENOUS | Status: DC | PRN
  Administered 2022-02-08: 50 ug/min via INTRAVENOUS

## 2022-02-08 MED ORDER — FAMOTIDINE 20 MG PO TABS
ORAL_TABLET | ORAL | Status: AC
  Filled 2022-02-08: qty 1

## 2022-02-08 MED ORDER — DOCUSATE SODIUM 100 MG PO CAPS
100.0000 mg | ORAL_CAPSULE | Freq: Two times a day (BID) | ORAL | Status: DC
  Administered 2022-02-08 – 2022-02-11 (×6): 100 mg via ORAL
  Filled 2022-02-08 (×7): qty 1

## 2022-02-08 MED ORDER — BUPIVACAINE HCL (PF) 0.5 % IJ SOLN
INTRAMUSCULAR | Status: DC | PRN
  Administered 2022-02-08: 3 mL via INTRATHECAL

## 2022-02-08 MED ORDER — LIDOCAINE HCL (PF) 2 % IJ SOLN
INTRAMUSCULAR | Status: AC
  Filled 2022-02-08: qty 5

## 2022-02-08 MED ORDER — METOCLOPRAMIDE HCL 5 MG/ML IJ SOLN
5.0000 mg | Freq: Three times a day (TID) | INTRAMUSCULAR | Status: DC | PRN
  Administered 2022-02-08: 10 mg via INTRAVENOUS
  Filled 2022-02-08: qty 2

## 2022-02-08 MED ORDER — GLYCOPYRROLATE 0.2 MG/ML IJ SOLN
INTRAMUSCULAR | Status: AC
  Filled 2022-02-08: qty 1

## 2022-02-08 MED ORDER — OXYMETAZOLINE HCL 0.05 % NA SOLN
NASAL | Status: DC | PRN
  Administered 2022-02-08: 2 via NASAL

## 2022-02-08 MED ORDER — PANTOPRAZOLE SODIUM 40 MG PO TBEC
40.0000 mg | DELAYED_RELEASE_TABLET | Freq: Every day | ORAL | Status: DC
  Administered 2022-02-09 – 2022-02-11 (×3): 40 mg via ORAL
  Filled 2022-02-08 (×4): qty 1

## 2022-02-08 MED ORDER — TRAMADOL HCL 50 MG PO TABS: 50.0000 mg | ORAL_TABLET | Freq: Four times a day (QID) | ORAL | Status: DC | PRN

## 2022-02-08 MED ORDER — METOCLOPRAMIDE HCL 5 MG PO TABS
5.0000 mg | ORAL_TABLET | Freq: Three times a day (TID) | ORAL | Status: DC | PRN
  Filled 2022-02-08: qty 2

## 2022-02-08 MED ORDER — GLYCOPYRROLATE PF 0.2 MG/ML IJ SOSY
PREFILLED_SYRINGE | INTRAMUSCULAR | Status: DC | PRN
  Administered 2022-02-08: .2 mg via INTRAVENOUS

## 2022-02-08 MED ORDER — BUPIVACAINE HCL (PF) 0.5 % IJ SOLN
INTRAMUSCULAR | Status: AC
  Filled 2022-02-08: qty 10

## 2022-02-08 MED ORDER — FAMOTIDINE 20 MG PO TABS
20.0000 mg | ORAL_TABLET | Freq: Once | ORAL | Status: AC
  Administered 2022-02-08: 20 mg via ORAL

## 2022-02-08 MED ORDER — KETOROLAC TROMETHAMINE 30 MG/ML IJ SOLN
INTRAMUSCULAR | Status: AC
  Filled 2022-02-08: qty 1

## 2022-02-08 MED ORDER — CHLORHEXIDINE GLUCONATE 0.12 % MT SOLN
OROMUCOSAL | Status: AC
  Filled 2022-02-08: qty 15

## 2022-02-08 MED ORDER — MORPHINE SULFATE (PF) 2 MG/ML IV SOLN
0.5000 mg | INTRAVENOUS | Status: DC | PRN
  Administered 2022-02-09: 1 mg via INTRAVENOUS
  Filled 2022-02-08 (×2): qty 1

## 2022-02-08 MED ORDER — PHENYLEPHRINE HCL (PRESSORS) 10 MG/ML IV SOLN
INTRAVENOUS | Status: AC
  Filled 2022-02-08: qty 1

## 2022-02-08 MED ORDER — MIDAZOLAM HCL 2 MG/2ML IJ SOLN
INTRAMUSCULAR | Status: AC
  Filled 2022-02-08: qty 2

## 2022-02-08 MED ORDER — DEXAMETHASONE SODIUM PHOSPHATE 10 MG/ML IJ SOLN: 8.0000 mg | Freq: Once | INTRAMUSCULAR | Status: DC

## 2022-02-08 MED ORDER — MIDAZOLAM HCL 5 MG/5ML IJ SOLN
INTRAMUSCULAR | Status: DC | PRN
  Administered 2022-02-08: 2 mg via INTRAVENOUS

## 2022-02-08 MED ORDER — FENTANYL CITRATE (PF) 100 MCG/2ML IJ SOLN: 25.0000 ug | INTRAMUSCULAR | Status: DC | PRN

## 2022-02-08 MED ORDER — ONDANSETRON HCL 4 MG/2ML IJ SOLN: 4.0000 mg | Freq: Once | INTRAMUSCULAR | Status: DC | PRN

## 2022-02-08 MED ORDER — LIDOCAINE HCL (CARDIAC) PF 100 MG/5ML IV SOSY
PREFILLED_SYRINGE | INTRAVENOUS | Status: DC | PRN
  Administered 2022-02-08: 40 mg via INTRAVENOUS

## 2022-02-08 MED ORDER — BUPIVACAINE LIPOSOME 1.3 % IJ SUSP
INTRAMUSCULAR | Status: AC
  Filled 2022-02-08: qty 20

## 2022-02-08 MED ORDER — OXYMETAZOLINE HCL 0.05 % NA SOLN
NASAL | Status: AC
  Filled 2022-02-08: qty 15

## 2022-02-08 MED ORDER — FENTANYL CITRATE (PF) 100 MCG/2ML IJ SOLN
INTRAMUSCULAR | Status: AC
  Administered 2022-02-08: 50 ug via INTRAVENOUS
  Filled 2022-02-08: qty 2

## 2022-02-08 MED ORDER — MENTHOL 3 MG MT LOZG: 1.0000 | LOZENGE | OROMUCOSAL | Status: DC | PRN

## 2022-02-08 MED ORDER — PROPOFOL 10 MG/ML IV BOLUS
INTRAVENOUS | Status: DC | PRN
  Administered 2022-02-08: 40 mg via INTRAVENOUS
  Administered 2022-02-08: 50 ug/kg/min via INTRAVENOUS
  Administered 2022-02-08: 20 mg via INTRAVENOUS
  Administered 2022-02-08: 40 mg via INTRAVENOUS

## 2022-02-08 MED ORDER — SURGIRINSE WOUND IRRIGATION SYSTEM - OPTIME
TOPICAL | Status: DC | PRN
  Administered 2022-02-08: 2500 mL

## 2022-02-08 SURGICAL SUPPLY — 85 items
AUGMENT PSN TIB EF 15 HALF BLK (Miscellaneous) IMPLANT
BLADE FLEX CHISEL 8 2.5 (MISCELLANEOUS) IMPLANT
BLADE SAGITTAL WIDE XTHICK NO (BLADE) IMPLANT
BLADE SAW 90X13X1.19 OSCILLAT (BLADE) IMPLANT
BLADE SAW GIGLI 510 (BLADE) IMPLANT
BLADE SAW SAG 25X90X1.19 (BLADE) IMPLANT
BNDG ELASTIC 6X5.8 VLCR STR LF (GAUZE/BANDAGES/DRESSINGS) ×1 IMPLANT
BOWL CEMENT MIX W/ADAPTER (MISCELLANEOUS) ×1 IMPLANT
CEMENT BONE R 1X40 (Cement) IMPLANT
CHLORAPREP W/TINT 26 (MISCELLANEOUS) ×2 IMPLANT
CNTNR SPEC 2.5X3XGRAD LEK (MISCELLANEOUS)
COMPONENT PSN TIB FIX SZ F LT (Miscellaneous) IMPLANT
CONT SPEC 4OZ STER OR WHT (MISCELLANEOUS)
CONTAINER SPEC 2.5X3XGRAD LEK (MISCELLANEOUS) IMPLANT
COOLER POLAR GLACIER W/PUMP (MISCELLANEOUS) ×1 IMPLANT
CUFF TOURN SGL QUICK 24 (TOURNIQUET CUFF)
CUFF TOURN SGL QUICK 34 (TOURNIQUET CUFF)
CUFF TRNQT CYL 24X4X16.5-23 (TOURNIQUET CUFF) IMPLANT
CUFF TRNQT CYL 34X4.125X (TOURNIQUET CUFF) IMPLANT
DERMABOND ADVANCED .7 DNX12 (GAUZE/BANDAGES/DRESSINGS) IMPLANT
DRAPE 3/4 80X56 (DRAPES) ×1 IMPLANT
DRAPE INCISE IOBAN 66X60 STRL (DRAPES) IMPLANT
DRAPE U-SHAPE 47X51 STRL (DRAPES) ×1 IMPLANT
DRSG MEPILEX SACRM 8.7X9.8 (GAUZE/BANDAGES/DRESSINGS) ×1 IMPLANT
DRSG OPSITE POSTOP 4X10 (GAUZE/BANDAGES/DRESSINGS) IMPLANT
DRSG OPSITE POSTOP 4X8 (GAUZE/BANDAGES/DRESSINGS) IMPLANT
ELECT CAUTERY BLADE 6.4 (BLADE) ×1 IMPLANT
ELECT REM PT RETURN 9FT ADLT (ELECTROSURGICAL) ×1
ELECTRODE REM PT RTRN 9FT ADLT (ELECTROSURGICAL) ×1 IMPLANT
GLOVE BIOGEL PI IND STRL 8 (GLOVE) ×1 IMPLANT
GLOVE PI ORTHO PRO STRL 7.5 (GLOVE) ×3 IMPLANT
GLOVE PI ORTHO PRO STRL SZ8 (GLOVE) ×3 IMPLANT
GLOVE PROTEXIS LATEX SZ 7.5 (GLOVE) ×1 IMPLANT
GLOVE SURG LATEX 7.5 PF (GLOVE) ×1 IMPLANT
GOWN STRL REUS W/ TWL LRG LVL3 (GOWN DISPOSABLE) ×1 IMPLANT
GOWN STRL REUS W/ TWL XL LVL3 (GOWN DISPOSABLE) ×2 IMPLANT
GOWN STRL REUS W/TWL LRG LVL3 (GOWN DISPOSABLE) ×1
GOWN STRL REUS W/TWL XL LVL3 (GOWN DISPOSABLE) ×2
HDLS TROCR DRIL PIN KNEE 75 (PIN) ×1
HOOD PEEL AWAY T7 (MISCELLANEOUS) ×2 IMPLANT
INSERT TIB PS CMTLS CC XSM (Insert) IMPLANT
INSTR SCRW HEX REV FIX 3.5X48 (ORTHOPEDIC DISPOSABLE SUPPLIES) ×1
INSTRUMENT SCRW HEX REV 3.5X48 (ORTHOPEDIC DISPOSABLE SUPPLIES) IMPLANT
IV NS IRRIG 3000ML ARTHROMATIC (IV SOLUTION) ×1 IMPLANT
KIT TURNOVER KIT A (KITS) ×1 IMPLANT
LINER TIB KNEE PS EF/6-9 12 LT (Liner) IMPLANT
MANIFOLD NEPTUNE II (INSTRUMENTS) ×1 IMPLANT
MARKER SKIN DUAL TIP RULER LAB (MISCELLANEOUS) ×1 IMPLANT
MAT ABSORB  FLUID 56X50 GRAY (MISCELLANEOUS) ×1
MAT ABSORB FLUID 56X50 GRAY (MISCELLANEOUS) ×1 IMPLANT
NDL HYPO 21X1.5 SAFETY (NEEDLE) ×1 IMPLANT
NEEDLE HYPO 21X1.5 SAFETY (NEEDLE) ×1 IMPLANT
NS IRRIG 500ML POUR BTL (IV SOLUTION) ×1 IMPLANT
OSTEOTOME THIN 10 5 (MISCELLANEOUS) IMPLANT
PACK TOTAL KNEE (MISCELLANEOUS) ×1 IMPLANT
PAD WRAPON POLAR KNEE (MISCELLANEOUS) ×1 IMPLANT
PENCIL SMOKE EVACUATOR (MISCELLANEOUS) ×1 IMPLANT
PIN DRILL HDLS TROCAR 75 4PK (PIN) IMPLANT
PROSTHESIS FEM CMT STD SZ9 LT (Miscellaneous) IMPLANT
PULSAVAC PLUS IRRIG FAN TIP (DISPOSABLE) ×1
SCREW HEX HEADED 3.5X27 DISP (ORTHOPEDIC DISPOSABLE SUPPLIES) IMPLANT
SOLUTION IRRIG SURGIPHOR (IV SOLUTION) ×1 IMPLANT
STEM EXT KNEE 6 OFFSET 15X135 (Miscellaneous) IMPLANT
STEM EXT KNEE PF OFF 17X135X3 (Stem) IMPLANT
SUT DVC 2 QUILL PDO  T11 36X36 (SUTURE) ×1
SUT DVC 2 QUILL PDO T11 36X36 (SUTURE) ×1 IMPLANT
SUT QUILL MONODERM 3-0 PS-2 (SUTURE) ×1 IMPLANT
SUT VIC AB 0 CT1 36 (SUTURE) ×1 IMPLANT
SUT VIC AB 1 CT1 36 (SUTURE) ×1 IMPLANT
SUT VIC AB 2-0 CT1 (SUTURE) IMPLANT
SUT VIC AB 2-0 CT2 27 (SUTURE) ×1 IMPLANT
SUT VICRYL 1-0 27IN ABS (SUTURE) ×1
SUTURE VICRYL 1-0 27IN ABS (SUTURE) ×1 IMPLANT
SWAB CULTURE AMIES ANAERIB BLU (MISCELLANEOUS) IMPLANT
SYR 20ML LL LF (SYRINGE) ×2 IMPLANT
SYR 3ML LL SCALE MARK (SYRINGE) ×1 IMPLANT
TAPE CLOTH 3X10 WHT NS LF (GAUZE/BANDAGES/DRESSINGS) ×1 IMPLANT
TIP FAN IRRIG PULSAVAC PLUS (DISPOSABLE) ×1 IMPLANT
TOWEL OR 17X26 4PK STRL BLUE (TOWEL DISPOSABLE) IMPLANT
TRAP FLUID SMOKE EVACUATOR (MISCELLANEOUS) ×1 IMPLANT
TRAY FOLEY SLVR 16FR LF STAT (SET/KITS/TRAYS/PACK) ×1 IMPLANT
TUBE KAMVAC SUCTION (TUBING) ×1 IMPLANT
WATER STERILE IRR 1000ML POUR (IV SOLUTION) ×1 IMPLANT
WEDGE FEM F/ARTHRO 9X5 (Miscellaneous) IMPLANT
WRAPON POLAR PAD KNEE (MISCELLANEOUS) ×1

## 2022-02-08 NOTE — Evaluation (Addendum)
Physical Therapy Evaluation Patient Details Name: Ellen Garcia MRN: [id number] DOB: 17-Jul-1955 Today's Date: 02/08/2022  History of Present Illness  66 yo female s/p left TKA revision. PMH includes HTN, DM, Diverticulosis, OA, RA, TKA, and obesity  Clinical Impression  Pt found supine in bed upon PT entry. Supine<>Sit with supervision and CGA. Sit<>stand with CGA and RW. Stand pivot transfer to the recliner. Pt performed mobility exercises with min VC for technique and demonstrated understanding. Pt would benefit from skilled physical therapy to address the following deficits (see below) to increase independence with ADLs and safety in the home. Follow physicians recommendation for discharge.        Recommendations for follow up therapy are one component of a multi-disciplinary discharge planning process, led by the attending physician.  Recommendations may be updated based on patient status, additional functional criteria and insurance authorization.  Follow Up Recommendations Home health PT Can patient physically be transported by private vehicle: Yes    Assistance Recommended at Discharge Intermittent Supervision/Assistance  Patient can return home with the following  A little help with walking and/or transfers;A little help with bathing/dressing/bathroom;Assistance with cooking/housework;Assist for transportation;Help with stairs or ramp for entrance    Equipment Recommendations None recommended by PT  Recommendations for Other Services  OT consult    Functional Status Assessment Patient has had a recent decline in their functional status and demonstrates the ability to make significant improvements in function in a reasonable and predictable amount of time.     Precautions / Restrictions Precautions Precautions: Knee Precaution Booklet Issued: Yes (comment) Precaution Comments: WBAT Restrictions Weight Bearing Restrictions: Yes LLE Weight Bearing: Weight bearing as  tolerated      Mobility  Bed Mobility Overal bed mobility: Needs Assistance Bed Mobility: Supine to Sit Rolling: Supervision   Supine to sit: Min guard, HOB elevated     General bed mobility comments: increased time and HOB raised    Transfers Overall transfer level: Needs assistance Equipment used: Rolling walker (2 wheels) Transfers: Sit to/from Stand Sit to Stand: Min guard                Ambulation/Gait                  Stairs            Wheelchair Mobility    Modified Rankin (Stroke Patients Only)       Balance Overall balance assessment: Needs assistance Sitting-balance support: Feet supported Sitting balance-Leahy Scale: Fair     Standing balance support: Bilateral upper extremity supported, Reliant on assistive device for balance Standing balance-Leahy Scale: Good                               Pertinent Vitals/Pain Pain Assessment Pain Assessment: Faces Faces Pain Scale: Hurts a little bit Pain Descriptors / Indicators: Grimacing, Guarding Pain Intervention(s): Repositioned, Ice applied    Home Living Family/patient expects to be discharged to:: Private residence Living Arrangements: Spouse/significant other Available Help at Discharge: Family Type of Home: House Home Access: Stairs to enter Entrance Stairs-Rails: None Entrance Stairs-Number of Steps: 3   Home Layout: Able to live on main level with bedroom/bathroom Home Equipment: Cane - single Information systems manager (2 wheels);Rollator (4 wheels)      Prior Function Prior Level of Function : Independent/Modified Independent  Hand Dominance        Extremity/Trunk Assessment   Upper Extremity Assessment Upper Extremity Assessment: Overall WFL for tasks assessed    Lower Extremity Assessment Lower Extremity Assessment: Generalized weakness    Cervical / Trunk Assessment Cervical / Trunk Assessment: Normal   Communication   Communication: No difficulties  Cognition Arousal/Alertness: Awake/alert Behavior During Therapy: WFL for tasks assessed/performed Overall Cognitive Status: Within Functional Limits for tasks assessed                                          General Comments      Exercises Total Joint Exercises Ankle Circles/Pumps: AROM, Strengthening, Both, 10 reps Quad Sets: AROM, Both, 10 reps, Strengthening Hip ABduction/ADduction: AROM, Right, Left, Strengthening, 10 reps   Assessment/Plan    PT Assessment Patient needs continued PT services  PT Problem List Decreased strength;Decreased balance;Decreased range of motion;Decreased mobility;Decreased activity tolerance;Pain       PT Treatment Interventions DME instruction;Functional mobility training;Balance training;Patient/family education;Gait training;Therapeutic activities;Neuromuscular re-education;Stair training;Therapeutic exercise    PT Goals (Current goals can be found in the Care Plan section)  Acute Rehab PT Goals Patient Stated Goal: to go to SNF PT Goal Formulation: With patient Time For Goal Achievement: 02/22/22 Potential to Achieve Goals: Good    Frequency BID     Co-evaluation               AM-PAC PT "6 Clicks" Mobility  Outcome Measure Help needed turning from your back to your side while in a flat bed without using bedrails?: A Little Help needed moving from lying on your back to sitting on the side of a flat bed without using bedrails?: A Little Help needed moving to and from a bed to a chair (including a wheelchair)?: A Little Help needed standing up from a chair using your arms (e.g., wheelchair or bedside chair)?: A Little Help needed to walk in hospital room?: A Little Help needed climbing 3-5 steps with a railing? : A Lot 6 Click Score: 17    End of Session Equipment Utilized During Treatment: Gait belt Activity Tolerance: Patient tolerated treatment well Patient  left: in chair;with call bell/phone within reach;with chair alarm set Nurse Communication: Mobility status PT Visit Diagnosis: Unsteadiness on feet (R26.81);Muscle weakness (generalized) (M62.81);Difficulty in walking, not elsewhere classified (R26.2);Pain Pain - Right/Left: Left Pain - part of body: Knee    Time: 7628-3151 PT Time Calculation (min) (ACUTE ONLY): 14 min   Charges:              Aletha Halim, SPT 02/08/2022, 4:33 PM

## 2022-02-08 NOTE — Op Note (Signed)
Patient Name: Ellen Garcia, Ellen Garcia  QQI:[id number]  Pre-Operative Diagnosis: Painful and loose left total knee arthroplasty  Post-Operative Diagnosis: (same)  Procedure: Complete revision left total knee arthroplasty  Components/Implants: Femur Persona Revision Size 9 with 24mmx135mm 15mm offset stem and 26mm posterior lateral augment   Tibia Persona revision Size F base plate with [id number] stem and medial 32mm augment   Poly 46mm CPS  central tibia cone size X-small  Date of Surgery: 02/08/2022  Surgeon: [name] MD  Assistant: Dorise Hiss (present and scrubbed throughout the case, critical for assistance with exposure, retraction, instrumentation, and closure)   Anesthesiologist: Rosey Bath  Anesthesia: Spinal   Tourniquet Time: 118  EBL: 448   Complications: None  Brief history: The patient is a 66 year old female with a history of left total knee replacement. The patient had increasing pain ongoing in their knee ever since was worked up for painful total knee .  The patient was found to have gross loosening of the tibial component of their knee confirmed on xray and CT scan to have severe varus subluxation and erosion through the anterolateral tibial cortex .  Their knee underwent an aspiration and blood work-up which was negative for infection .  Despite multiple attempts at conservative management the patient had increasing pain and functional disability .   The risks and benefits of revision total knee arthroplasty as definitive surgical treatment were discussed with the patient, who opted to proceed with the operation.  All preoperative films were reviewed and an appropriate surgical plan was made prior to surgery. Preoperative range of motion was 0 to 90 with  no contracture. The patient was identified as having a neutral alignment.    Description of procedure: The patient was brought to the operating room where laterality was confirmed by all those present to be the left side.     Spinal anesthesia was administered and the patient received an intravenous dose of antibiotics for surgical prophylaxis and a dose of tranexamic acid.  Patient is positioned supine on the operating room table with all bony prominences well-padded.  A well-padded tourniquet was applied to the left thigh.  The knee was then prepped and draped in usual sterile fashion with multiple layers of adhesive and nonadhesive drapes.  All of those present in the operating room participated in a surgical timeout laterality and patient were confirmed.     An Esmarch was wrapped around the extremity and the leg was elevated and the knee flexed.  The tourniquet was inflated to a pressure of 275 mmHg. The Esmarch was removed and the leg was brought down to full extension.  The patella and tibial tubercle identified as well as the previous surgical incision and outlined using a marking pen and a curvilinear medial midline incision was made and continuation of the prior incision with a knife carried through the subcutaneous tissue down to the extensor retinaculum.  After exposure of the extensor mechanism the medial parapatellar arthrotomy was performed with a scalpel and electrocautery extending down medial and distal to the tibial tubercle taking care to avoid incising the patellar tendon.    A standard medial release was performed over the proximal tibia exposing the existing tibial baseplate and polyethylene.  The knee was brought into extension in order to excise the scar tissue around the patellar tendon with care taken to avoid injuring the patellar tendon .  The synovium was found to be inflamed around the tibia.  Synovial fluid was found to be normal in appearance with no  signs of infection.  A piece of synovium was removed and sent to pathology for evaluation under frozen section and found to have no signs of inflammation.   Scar excision and synovectomy was performed in the medial lateral gutters to aid in  visualization and exposure of the components.  The interface around the femoral component and the tibial component and bone were cleared using electrocautery and rongeurs.  The interface between the femoral component and the bone was worked with a flexible osteotomes and a Gigli saw.  The chamfer interfaces were exposed and worked with a flexible osteotome.  After circumferential release of the interface between the bone and the femoral component the femoral component was tamped off with minimal bone loss.   Attention was then turned to the tibial component the interface between the tibial component the bone was worked with an oscillating saw and flexible osteotomes.  The component was found to be grossly loose after minimal work . the tibia component was then removed utilizing a threaded insert with a slap hammer with no bone loss at all.  After removing the tibia it was found to have debonded from the cement laterally and medially beneath the cement mantle with a palpable defect in the anterolateral tibia below the tibial tubercle   Both the tibial and femoral canals were accessed at this time and were sequentially reamed up by hand reaching a size 15 mm reamer on the tibia with good fit and fill and a size 17 mm reamer on the femur with good fit and fill.  An xray was taken to confirm intracortical placement of the tibial stem with adequate distance past the tibial defect. The tibia was then aligned with a cutting jig and a freshen up cut was made through the 0 hole.  Tibial baseplate size F was appropriately sized and fit well over the tibia with a 6 mm offset.  The tibia was also prepared at this time for a central cone due to central bone loss it was reamed and a small central cone trial was placed.  The trial tibia was placed and due to bone loss a 63mm size medial augment was prepared for. A full tibial trial was then placed and attention was turned back to the femur. The femoral canal was tilted reamed  and a size 9 trial femur was put in place with freshen up cuts through the cut slots as needed. The 9 size trial femur was placed which provided for good posterior offset.  The box was opened with an oscillating saw and the trochlear component was placed.  A trial polyethylene was placed and the knee was taken through range of motion.  The knee was found to have good range of motion with full extension and flexion to 120 degrees with good stability through range of motion with a CPS component.   The patella was evaluated and found to have good fixation to bone with appropriate size and minimal scar overgrowth which was excised with cautery.  All the trial components were removed and placed on the back table to use with assembling the actual implants.   The correct final components for implantation were confirmed and opened by the circulator nurse.  The prepared surfaces of the  femur and tibia were cleaned with pulsatile lavage to remove all blood fat and other material and then the surfaces were dried.  2 bags of cement were mixed under vacuum and the components were cemented into place.  Excess cement was removed  with curettes and forceps.  The final polyethylene tibial component was implanted and the knee was brought into full extension to allow the cement to set.  At this time the periarticular injection cocktail was placed in the soft tissues surrounding the knee.  The knee was then irrigated with copious amount of normal saline via pulsatile lavage to remove all loose bodies and other debris.  The knee was then irrigated with surgiphor betadine based wash and reirrigated with saline.  The tourniquet was then dropped and all bleeding vessels were identified and coagulated.  The arthrotomy was approximated with #1 Vicryl and closed with #2 Quill suture.  The knee was brought into slight flexion and the subcutaneous tissues were closed with 0 Vicryl, 2-0 Vicryl and a running subcuticular 3-0 barbed monocryl  based suture.  Skin was then glued with Dermabond.  A sterile adhesive dressing was then placed along with a sequential compression device to the calf, a Ted stocking, and a cryotherapy cuff.     Sponge, needle, and Lap counts were all correct at the end of the case.   The patient was transferred off of the operating room table to a hospital bed, good pulses were found distally on the operative side.  The patient was transferred to the recovery room in stable condition.

## 2022-02-08 NOTE — H&P (Signed)
History of Present Illness: The patient is an 66 y.o. female seen in clinic today for left knee pain and instability s/p Left total knee replacement performed October 2018.  Patient reports over the last year she had increasing pain in her left leg which has been worsening over the past few weeks to the point that she could not walk and due to the instability in her leg had a fall .  She reports sensations of instability when getting up or down from a chair and has been using a cane for ambulation .  She has been using a knee brace with some improvement in stability. pain today is a  5/10.  The patient denies fevers, chills, numbness, tingling, shortness of breath, chest pain, recent illness, or any other trauma.     Past Medical History:     Past Medical History:  Diagnosis Date   Diverticulosis 10/30/2013   GERD (gastroesophageal reflux disease)     Hypertension     Osteoarthritis 10/30/2013    a.  Left knee   b.  Right knee meniscal tear.    Rheumatoid arthritis ([place]) 10/30/2013    a.  Positive rheumatoid factor, positive anti-CCP antibodies.   b.  Methotrexate injectable, intolerance.    c.  S/p Remicade.   d.  Enbrel   e.  Plaquenil.       Past Surgical History:      Past Surgical History:  Procedure Laterality Date   cyst removal   1975    neck   COLONOSCOPY   12/23/2008    Dr. Mamie Nick. Oh @ [facility] - Diverticulosis, rpt 10 yrs per [facility]   Total knee arhtroplasty Left 02/01/2017    Dr.Menz   COLONOSCOPY   12/26/2018    Diverticulosis/Otherwise normal/Repeat 27yr/TKT   HYSTERECTOMY       KNEE ARTHROSCOPY          Past Family History: Family History       Family History  Problem Relation Age of Onset   High blood pressure (Hypertension) Mother     High blood pressure (Hypertension) Father     Gout Father     Gout Brother     Gout Brother         Medications:       Current Outpatient Medications Ordered in Epic  Medication Sig Dispense Refill   adalimumab (HUMIRA,CF, PEN) 40  mg/0.4 mL pen injector kit INJECT 40MG SUBCUTANEOUSLY EVERY OTHER WEEK 1 kit 5   amLODIPine (NORVASC) 5 MG tablet TAKE 1 TABLET BY MOUTH ONCE  DAILY 100 tablet 2   b complex vitamins tablet Take 1 tablet by mouth once daily.       biotin-keratin 10,000-100 mcg-mg Tab Take 1 tablet by mouth once daily.        cyclobenzaprine (FLEXERIL) 5 MG tablet Take 1 tablet (5 mg total) by mouth 3 (three) times daily as needed for Muscle spasms 180 tablet 1   diphenhydrAMINE (BENADRYL) 25 mg capsule Take 1 capsule (25 mg total) by mouth 2 (two) times daily as needed (for rash) 30 capsule 0   gabapentin (NEURONTIN) 300 MG capsule Take 300 mg by mouth 3 (three) times daily       glipiZIDE (GLUCOTROL XL) 5 MG XL tablet TAKE 1 TABLET BY MOUTH ONCE  DAILY 100 tablet 2   HUMIRA,CF, PEN 40 mg/0.4 mL pen injector kit Inject 40 mg subcutaneously every 14 (fourteen) days 1 kit 5   HUMIRA,CF, PEN 40 mg/0.4 mL pen  injector kit INJECT 40MG SUBCUTANEOUSLY  EVERY OTHER WEEK 2 kit 6   HUMIRA,CF, PEN 40 mg/0.4 mL pen injector kit INJECT 40MG SUBCUTANEOUSLY EVERY OTHER WEEK 1 kit 5   ibuprofen (MOTRIN) 800 MG tablet TAKE 1 TABLET BY MOUTH  EVERY 8 HOURS AS NEEDED FOR PAIN 270 tablet 3   multivitamin tablet Take 1 tablet by mouth once daily.       pioglitazone (ACTOS) 30 MG tablet Take 1 tablet (30 mg total) by mouth once daily 90 tablet 3    No current Epic-ordered facility-administered medications on file.      Allergies:      Allergies  Allergen Reactions   Zanaflex [Tizanidine] Hives   Hydrochlorothiazide Other (See Comments)      Caused hair loss Caused hair loss   Penicillin V Potassium Hives      As a child     Penicillins Hives and Other (See Comments)      Has patient had a PCN reaction causing immediate rash, facial/tongue/throat swelling, SOB or lightheadedness with hypotension: Yes Has patient had a PCN reaction causing severe rash involving mucus membranes or skin necrosis: Yes Has patient had a PCN  reaction that required hospitalization: No Has patient had a PCN reaction occurring within the last 10 years: No If all of the above answers are "NO", then may proceed with Cephalosporin use. As a child     Lisinopril Rash and Other (See Comments)      Caused rash on hands Caused rash on hands      Review of Systems:  A comprehensive 14 point ROS was performed, reviewed, and the pertinent orthopaedic findings are documented in the HPI.   Physical Exam: General/Constitutional: No apparent distress: well-nourished and well developed. Eyes: Pupils equal, round with synchronous movement.   Pulmonary exam: Lungs clear to auscultation bilaterally no wheezing rales or rhonchi Cardiac exam: Regular rate and rhythm no obvious murmurs rubs or gallops.   Integumentary: No impressive skin lesions present, except as noted in detailed exam. Neuro/Psych: Normal mood and affect, oriented to person, place and time.   Comprehensive Knee Exam: Gait Non-antalgic and fluid   Alignment Neutral    Inspection   Right Left  Skin Normal appearance with no obvious deformity.  No ecchymosis or erythema.   Normal appearance with no obvious deformity.  No ecchymosis or erythema. Well-healed midline incision  Soft Tissue No focal soft tissue swelling No focal soft tissue swelling  Quad Atrophy None None    Palpation    Right Left  Tenderness Medial lateral joint line tenderness Medial and lateral joint line tenderness  Crepitus + patellofemoral and  tibiofemoral crepitus No patellofemoral   Effusion None None    Range of Motion   Right Left  Flexion  0-90 0-90  Extension  Full knee extension without hyperextension Full knee extension without hyperextension    Ligamentous Exam   Right Left  AP motion in flexion N/A 3-5 mm  varus valgus extension N/A 5-84m  Varus valgus in flexion N/A 10+mm  Able to straight leg raise Full without lag Full without lag      Neurovascular   Right Left  Quadriceps  Strength 5/5 5/5  Hamstring Strength 5/5 5/5  Hip Abductor Strength 4/5 4/5  Distal Motor Normal Normal  Distal Sensory Normal light touch sensation Normal light touch sensation  Distal Pulses Normal Normal      Imaging Studies:   I reviewed AP lateral and sunrise x-rays of the  left knee taken on 01/08/2022 which show subsidence and varus angulation to the tibial component of the total knee replacement with abutment and possible penetration of the lateral cortex of the tibia with signs of bony overgrowth and lucency around the tibial tray and stem.  X-rays of the right knee show Arthritic severe arthritic changes with medial and lateral joint space narrowing and patellofemoral disease Kellgren-Lawrence grade 4   CT scan reviewed performed 01/13/2022 of the left knee which shows status post left total knee replacement femur in appropriate alignment with no fractures or loosening around it.  Tibial component is shown to be in significant varus with lucency under the tray and around the stem with perforation of the anterior lateral cortex of the tibia at the metadiaphyseal region.  Fractures or dislocations noted.    Component Ref Range & Units 7 d ago  Color, Synovial YELLOW YELLOW  Appearance-Synovial CLEAR HAZY Abnormal   Crystals, Fluid NO CRYSTALS SEEN  WBC, Synovial 0 - 200 /cu mm 259 High   Neutrophil, Synovial % 4  Lymphocytes-Synovial Fld % 70  Monocyte-Macrophage-Synovial Fluid % 26  Eosinophils-Synovial % 0      Assessment:  Loose left total knee, painful left total knee   Plan:     Ellen Garcia is a 66 year old female who presents with left total knee loosening and failure of the tibial component with perforation of the tibia and gross knee instability.. Based upon the patient's continued symptoms and failure to respond to conservative treatment, I have recommended a revision left total knee replacement for this patient. A long discussion took place with the patient  describing what a revision joint replacement is and what the procedure would entail. A knee model, similar to the implants that will be used during the operation, was utilized to demonstrate the implants. Choices of implant manufactures were discussed and reviewed. The ability to secure the implant utilizing cement or cementless (press fit) fixation was discussed. The approach and exposure was discussed.  The nature of the loosening of her component was discussed and the underlying etiology of her current instability.  Infectious work-up negative with cell count 259 with only 4% neutrophils, CRP 4, ESR mildly elevated at 38.  Culture no growth final from synovial fluid.    The hospitalization and post-operative care and rehabilitation were also discussed. The use of perioperative antibiotics and DVT prophylaxis were discussed. The risk, benefits and alternatives to a surgical intervention were discussed at length with the patient. The patient was also advised of risks related to the medical comorbidities and elevated body mass index (BMI). A lengthy discussion took place to review the most common complications including but not limited to: stiffness, loss of function, complex regional pain syndrome, deep vein thrombosis, pulmonary embolus, heart attack, stroke, infection, wound breakdown, numbness, damage to nerves, tendon,muscles, arteries or other blood vessels, death and other possible complications from anesthesia. The patient was told that we will take steps to minimize these risks by using sterile technique, antibiotics and DVT prophylaxis when appropriate and follow the patient postoperatively in the office setting to monitor progress. The possibility of recurrent pain, no improvement in pain and actual worsening of pain were also discussed with the patient.  An additional conversation was held with the patient given her elevated BMI and elevated hemoglobin A1c.  Despite the BMI and hemoglobin A1c being  above the normal conservative range for elective surgery I consider the surgery more urgent given the gross instability and cortical penetration of the tibial  component.  The patient and I discussed that these risks are increased in her due to her BMI and hemoglobin A1c and she acknowledged and accepted these risks given the urgent necessity of the procedure.    The discharge plan of care focused on the patient going home following surgery. The patient was encouraged to make the necessary arrangements to have someone stay with them when they are discharged home.    The benefits of surgery were discussed with the patient including the potential for improving the patient's current clinical condition through operative intervention. Alternatives to surgical intervention including continued conservative management were also discussed in detail. All questions were answered to the satisfaction of the patient. The patient participated and agreed to the plan of care as well as the use of the recommended implants for their total knee replacement surgery.   An information packet was given to the patient to review prior to surgery.   The patient received outpatient medical clearance. The history and physical exam were confirmed on the day of surgery. All questions answered. Patient agrees with above plan for left knee revision.

## 2022-02-08 NOTE — Interval H&P Note (Signed)
History and Physical Interval Note:  02/08/2022 7:24 AM  Ellen Garcia  has presented today for surgery, with the diagnosis of Loosening of prosthesis of left total knee replacement, initial encounter T84.033A Pain due to total left knee replacement, initial encounter T84.84XA, Y51.102.  The various methods of treatment have been discussed with the patient and family. After consideration of risks, benefits and other options for treatment, the patient has consented to  Procedure(s): Left total knee revision, both components (Left) as a surgical intervention.  The patient's history has been reviewed, patient examined, no change in status, stable for surgery.  I have reviewed the patient's chart and labs.  Questions were answered to the patient's satisfaction.     [name]

## 2022-02-08 NOTE — Transfer of Care (Signed)
Immediate Anesthesia Transfer of Care Note  Patient: Ellen Garcia  Procedure(s) Performed: Left total knee revision, both components (Left: Knee)  Patient Location: PACU  Anesthesia Type:Spinal  Level of Consciousness: awake and drowsy  Airway & Oxygen Therapy: Patient Spontanous Breathing and Patient connected to nasal cannula oxygen  Post-op Assessment: Report given to RN and Post -op Vital signs reviewed and stable  Post vital signs: Reviewed and stable  Last Vitals:  Vitals Value Taken Time  BP 108/66 02/08/22 1201  Temp 36.5 C 02/08/22 1201  Pulse 80 02/08/22 1201  Resp 18 02/08/22 1201  SpO2 95 % 02/08/22 1201    Last Pain:  Vitals:   02/08/22 0617  TempSrc: Temporal  PainSc: 8          Complications: No notable events documented.

## 2022-02-08 NOTE — Anesthesia Procedure Notes (Signed)
Spinal  Patient location during procedure: OR Start time: 02/08/2022 7:52 AM End time: 02/08/2022 8:08 AM Reason for block: surgical anesthesia Staffing Anesthesiologist: Martha Clan, MD Performed by: Cammie Sickle, CRNA Authorized by: Martha Clan, MD   Preanesthetic Checklist Completed: patient identified, IV checked, site marked, risks and benefits discussed, surgical consent, monitors and equipment checked, pre-op evaluation and timeout performed Spinal Block Patient position: sitting Prep: ChloraPrep Patient monitoring: heart rate, continuous pulse ox and blood pressure Approach: midline Location: L3-4 Injection technique: single-shot Needle Needle type: Introducer and Pencan  Needle gauge: 24 G Needle length: 10 cm Assessment Events: CSF return Additional Notes Negative paresthesia. Negative blood return. Positive free-flowing CSF. Expiration date of kit checked and confirmed. Patient tolerated procedure well, without complications. I attempted x2 unsuccessfully, kept hitting OS with needle, no complications noted. Dr. Rosey Bath attempted x1 successfully, No complications noted, Pt. Tolerated procedure well.

## 2022-02-08 NOTE — Anesthesia Preprocedure Evaluation (Signed)
Anesthesia Evaluation  Patient identified by MRN, date of birth, ID band Patient awake    Reviewed: Allergy & Precautions, NPO status , Patient's Chart, lab work & pertinent test results  History of Anesthesia Complications Negative for: history of anesthetic complications  Airway Mallampati: II  TM Distance: >3 FB Neck ROM: Full    Dental  (+) Dental Advidsory Given, Missing   Pulmonary neg shortness of breath, neg sleep apnea, neg COPD, neg recent URI, former smoker   breath sounds clear to auscultation- rhonchi (-) wheezing      Cardiovascular hypertension, Pt. on medications (-) angina (-) CAD, (-) Past MI, (-) Cardiac Stents and (-) CABG (-) dysrhythmias (-) Valvular Problems/Murmurs Rhythm:Regular Rate:Normal - Systolic murmurs and - Diastolic murmurs    Neuro/Psych neg Seizures negative neurological ROS  negative psych ROS   GI/Hepatic negative GI ROS, Neg liver ROS,,,  Endo/Other  diabetes  Morbid obesity  Renal/GU negative Renal ROS     Musculoskeletal  (+) Arthritis ,    Abdominal  (+) + obese  Peds  Hematology negative hematology ROS (+)   Anesthesia Other Findings Past Medical History: No date: Ankle fracture No date: Hypertension     Comment:  denies taking medication No date: Neuropathy     Comment:  legs No date: Neuropathy No date: Pre-diabetes     Comment:  diet controlled No date: Rheumatoid arthritis (HCC)   Reproductive/Obstetrics                             Anesthesia Physical Anesthesia Plan  ASA: 3  Anesthesia Plan: Spinal   Post-op Pain Management:    Induction: Intravenous  PONV Risk Score and Plan: 2 and Propofol infusion and TIVA  Airway Management Planned: Natural Airway and Simple Face Mask  Additional Equipment:   Intra-op Plan:   Post-operative Plan:   Informed Consent: I have reviewed the patients History and Physical, chart, labs and  discussed the procedure including the risks, benefits and alternatives for the proposed anesthesia with the patient or authorized representative who has indicated his/her understanding and acceptance.     Dental advisory given  Plan Discussed with: CRNA and Anesthesiologist  Anesthesia Plan Comments:         Anesthesia Quick Evaluation

## 2022-02-09 ENCOUNTER — Encounter: Payer: Self-pay | Admitting: Orthopedic Surgery

## 2022-02-09 LAB — CBC
HCT: 29.4 % — ABNORMAL LOW (ref 36.0–46.0)
Hemoglobin: 9.4 g/dL — ABNORMAL LOW (ref 12.0–15.0)
MCH: 28.1 pg (ref 26.0–34.0)
MCHC: 32 g/dL (ref 30.0–36.0)
MCV: 88 fL (ref 80.0–100.0)
Platelets: 244 10*3/uL (ref 150–400)
RBC: 3.34 MIL/uL — ABNORMAL LOW (ref 3.87–5.11)
RDW: 13.4 % (ref 11.5–15.5)
WBC: 9.2 10*3/uL (ref 4.0–10.5)
nRBC: 0 % (ref 0.0–0.2)

## 2022-02-09 LAB — BASIC METABOLIC PANEL
Anion gap: 7 (ref 5–15)
BUN: 19 mg/dL (ref 8–23)
CO2: 25 mmol/L (ref 22–32)
Calcium: 8.8 mg/dL — ABNORMAL LOW (ref 8.9–10.3)
Chloride: 109 mmol/L (ref 98–111)
Creatinine, Ser: 0.75 mg/dL (ref 0.44–1.00)
GFR, Estimated: >60 mL/min (ref >60)
Glucose, Bld: 119 mg/dL — ABNORMAL HIGH (ref 70–99)
Potassium: 4 mmol/L (ref 3.5–5.1)
Sodium: 141 mmol/L (ref 135–145)

## 2022-02-09 LAB — GLUCOSE, CAPILLARY
Glucose-Capillary: 139 mg/dL — ABNORMAL HIGH (ref 70–99)
Glucose-Capillary: 186 mg/dL — ABNORMAL HIGH (ref 70–99)
Glucose-Capillary: 143 mg/dL — ABNORMAL HIGH (ref 70–99)
Glucose-Capillary: 136 mg/dL — ABNORMAL HIGH (ref 70–99)

## 2022-02-09 LAB — SURGICAL PATHOLOGY

## 2022-02-09 MED ORDER — SODIUM CHLORIDE 0.9 % IV BOLUS
500.0000 mL | Freq: Once | INTRAVENOUS | Status: AC
  Administered 2022-02-09: 500 mL via INTRAVENOUS

## 2022-02-09 MED ORDER — AMLODIPINE BESYLATE 5 MG PO TABS
5.0000 mg | ORAL_TABLET | Freq: Every day | ORAL | Status: DC
  Administered 2022-02-10 – 2022-02-11 (×2): 5 mg via ORAL
  Filled 2022-02-09 (×2): qty 1

## 2022-02-09 NOTE — Progress Notes (Signed)
The patient is set up with [facility] prior to surgery by Surgeons office She lives at home with Family She has a rolling walker 3 in 1, Rollator and cane at home NO additional Needs

## 2022-02-09 NOTE — Progress Notes (Addendum)
Physical Therapy Treatment Patient Details Name: Ellen Garcia MRN: [id number] DOB: 12/10/1955 Today's Date: 02/09/2022   History of Present Illness 66 yo female s/p left TKA revision. PMH includes HTN, DM, Diverticulosis, OA, RA, TKA, and obesity    PT Comments    Pt found in chair upon PT entry with c/o 8/10 pain in her left knee s/p TKA revision. Orthostatics taken after pt reported feeling "woozy." Sit<>Stand CGA and RW. Pt positive for orthostatic hypotension however, BP resolved back to a normal range after 3 min of standing. Pt reported her symptoms had not resolved and sat back down in the chair with CGA and RW.  Pt would benefit from skilled physical therapy to address listed deficits (see below) to increase independence with ADLs and function. Current recommendation is to follow physicians recommendations for discharge therapies, may benefit from SNF due to limited progression in functional mobility.     Recommendations for follow up therapy are one component of a multi-disciplinary discharge planning process, led by the attending physician.  Recommendations may be updated based on patient status, additional functional criteria and insurance authorization.  Follow Up Recommendations  Follow physician's recommendations for discharge plan and follow up therapies Can patient physically be transported by private vehicle: Yes   Assistance Recommended at Discharge Intermittent Supervision/Assistance  Patient can return home with the following A little help with walking and/or transfers;A little help with bathing/dressing/bathroom;Assistance with cooking/housework;Assist for transportation;Help with stairs or ramp for entrance   Equipment Recommendations  None recommended by PT    Recommendations for Other Services       Precautions / Restrictions Precautions Precautions: Knee Precaution Booklet Issued: No Precaution Comments: WBAT Restrictions Weight Bearing Restrictions:  Yes LLE Weight Bearing: Weight bearing as tolerated     Mobility  Bed Mobility                    Transfers Overall transfer level: Needs assistance Equipment used: Rolling walker (2 wheels) Transfers: Sit to/from Stand Sit to Stand: Min guard           General transfer comment: no vc required    Ambulation/Gait                   Stairs             Wheelchair Mobility    Modified Rankin (Stroke Patients Only)       Balance Overall balance assessment: Needs assistance Sitting-balance support: Feet supported Sitting balance-Leahy Scale: Good     Standing balance support: Bilateral upper extremity supported, Reliant on assistive device for balance Standing balance-Leahy Scale: Fair Standing balance comment: pt reported being "lightheaded" and "woozy" upon standing                            Cognition Arousal/Alertness: Awake/alert Behavior During Therapy: WFL for tasks assessed/performed Overall Cognitive Status: Within Functional Limits for tasks assessed                                          Exercises Total Joint Exercises Ankle Circles/Pumps: AROM, Strengthening, Both, 10 reps Quad Sets: AROM, 10 reps, Strengthening, Left Hip ABduction/ADduction: AROM, Left, Strengthening, 10 reps    General Comments General comments (skin integrity, edema, etc.): once in the hall ambulating, pt endorsed feeling a bit lightheaded, worsening over time. Seated  and noted to be a bit clammy and nauseated. RN notified. Placed in recliner with BLE elevated, SCD on RLE, head reclined      Pertinent Vitals/Pain Pain Assessment Pain Score: 8  Pain Location: L knee Pain Descriptors / Indicators: Grimacing, Guarding, Crying, Moaning Pain Intervention(s): Monitored during session, Premedicated before session, Limited activity within patient's tolerance    Home Living Family/patient expects to be discharged to:: Private  residence Living Arrangements: Spouse/significant other Available Help at Discharge: Family;Friend(s);Available PRN/intermittently (spouse drives trucks and leaves 11/8, other family can come stay wiht her but not until 2 weeks from now. Has a friend who can come by 1-2x/day for brief visits if needed) Type of Home: House Home Access: Stairs to enter Entrance Stairs-Rails: None Entrance Stairs-Number of Steps: 3   Home Layout: Able to live on main level with bedroom/bathroom Home Equipment: Cane - single Information systems manager (2 wheels);Rollator (4 wheels)      Prior Function            PT Goals (current goals can now be found in the care plan section) Acute Rehab PT Goals Patient Stated Goal: to go to SNF PT Goal Formulation: With patient Time For Goal Achievement: 02/22/22 Potential to Achieve Goals: Good Progress towards PT goals: Progressing toward goals    Frequency    BID      PT Plan Current plan remains appropriate    Co-evaluation              AM-PAC PT "6 Clicks" Mobility   Outcome Measure  Help needed turning from your back to your side while in a flat bed without using bedrails?: A Little Help needed moving from lying on your back to sitting on the side of a flat bed without using bedrails?: A Little Help needed moving to and from a bed to a chair (including a wheelchair)?: A Little Help needed standing up from a chair using your arms (e.g., wheelchair or bedside chair)?: A Little Help needed to walk in hospital room?: A Little Help needed climbing 3-5 steps with a railing? : A Lot 6 Click Score: 17    End of Session Equipment Utilized During Treatment: Gait belt Activity Tolerance: Patient limited by fatigue Patient left: in chair;with call bell/phone within reach;with chair alarm set Nurse Communication: Mobility status PT Visit Diagnosis: Unsteadiness on feet (R26.81);Muscle weakness (generalized) (M62.81);Difficulty in walking, not  elsewhere classified (R26.2);Pain Pain - Right/Left: Left Pain - part of body: Knee     Time: 1117-1140 PT Time Calculation (min) (ACUTE ONLY): 22 min  Charges:  $Therapeutic Exercise: 8-22 mins $Therapeutic Activity: 8-22 mins                        [employer], SPT 02/09/2022, 12:41 PM

## 2022-02-09 NOTE — Progress Notes (Addendum)
Physical Therapy Treatment Patient Details Name: Ellen Garcia MRN: [id number] DOB: May 13, 1955 Today's Date: 02/09/2022   History of Present Illness 66 yo female s/p left TKA revision. PMH includes HTN, DM, Diverticulosis, OA, RA, TKA, and obesity    PT Comments    Pt found in chair upon PT entry with c/o 7/10 pain in left knee s/p TKA revision, did state her dizziness was a bit better than this morning. Sit<>Stand with CGA and RW. Pt transferred to [facility] with CGA and RW. Pt performed stand pivot transfer with RW and CGA to bed. Pt would benefit from skilled physical therapy to address the listed deficits (see below) to increase independence with ADLs and function. Current recommendation is to follow physician's plan for D/C.      Recommendations for follow up therapy are one component of a multi-disciplinary discharge planning process, led by the attending physician.  Recommendations may be updated based on patient status, additional functional criteria and insurance authorization.  Follow Up Recommendations  Follow physician's recommendations for discharge plan and follow up therapies Can patient physically be transported by private vehicle: Yes   Assistance Recommended at Discharge Intermittent Supervision/Assistance  Patient can return home with the following A little help with walking and/or transfers;A little help with bathing/dressing/bathroom;Assistance with cooking/housework;Assist for transportation;Help with stairs or ramp for entrance   Equipment Recommendations  None recommended by PT    Recommendations for Other Services       Precautions / Restrictions Precautions Precautions: Knee Precaution Booklet Issued: No Precaution Comments: WBAT Restrictions Weight Bearing Restrictions: Yes LLE Weight Bearing: Weight bearing as tolerated     Mobility  Bed Mobility                    Transfers Overall transfer level: Needs assistance Equipment used: Rolling  walker (2 wheels) Transfers: Sit to/from Stand Sit to Stand: Min guard           General transfer comment: no vc required    Ambulation/Gait                   Stairs             Wheelchair Mobility    Modified Rankin (Stroke Patients Only)       Balance Overall balance assessment: Needs assistance Sitting-balance support: Feet supported Sitting balance-Leahy Scale: Good     Standing balance support: Bilateral upper extremity supported, Reliant on assistive device for balance Standing balance-Leahy Scale: Fair Standing balance comment: pt reported being "lightheaded" and "woozy" upon standing                            Cognition Arousal/Alertness: Awake/alert Behavior During Therapy: WFL for tasks assessed/performed Overall Cognitive Status: Within Functional Limits for tasks assessed                                          Exercises Total Joint Exercises Ankle Circles/Pumps: AROM, Strengthening, Both, 10 reps Quad Sets: AROM, 10 reps, Strengthening, Left Hip ABduction/ADduction: AROM, Left, Strengthening, 10 reps    General Comments General comments (skin integrity, edema, etc.): once in the hall ambulating, pt endorsed feeling a bit lightheaded, worsening over time. Seated and noted to be a bit clammy and nauseated. RN notified. Placed in recliner with BLE elevated, SCD on RLE, head reclined  Pertinent Vitals/Pain Pain Assessment Pain Score: 7  Pain Location: L knee Pain Descriptors / Indicators: Grimacing, Guarding, Crying, Moaning Pain Intervention(s): Limited activity within patient's tolerance, Monitored during session, Premedicated before session, Repositioned, Ice applied    Home Living Family/patient expects to be discharged to:: Private residence Living Arrangements: Spouse/significant other Available Help at Discharge: Family;Friend(s);Available PRN/intermittently (spouse drives trucks and leaves  11/8, other family can come stay wiht her but not until 2 weeks from now. Has a friend who can come by 1-2x/day for brief visits if needed) Type of Home: House Home Access: Stairs to enter Entrance Stairs-Rails: None Entrance Stairs-Number of Steps: 3   Home Layout: Able to live on main level with bedroom/bathroom Home Equipment: Cane - single Information systems manager (2 wheels);Rollator (4 wheels)      Prior Function            PT Goals (current goals can now be found in the care plan section) Acute Rehab PT Goals Patient Stated Goal: to go to SNF PT Goal Formulation: With patient Time For Goal Achievement: 02/22/22 Potential to Achieve Goals: Good Progress towards PT goals: Progressing toward goals    Frequency    BID      PT Plan Current plan remains appropriate    Co-evaluation              AM-PAC PT "6 Clicks" Mobility   Outcome Measure  Help needed turning from your back to your side while in a flat bed without using bedrails?: A Little Help needed moving from lying on your back to sitting on the side of a flat bed without using bedrails?: A Little Help needed moving to and from a bed to a chair (including a wheelchair)?: A Little Help needed standing up from a chair using your arms (e.g., wheelchair or bedside chair)?: A Little Help needed to walk in hospital room?: A Little Help needed climbing 3-5 steps with a railing? : A Lot 6 Click Score: 17    End of Session Equipment Utilized During Treatment: Gait belt Activity Tolerance: Patient limited by fatigue Patient left: in bed;with bed alarm set;with family/visitor present;with call bell/phone within reach Nurse Communication: Mobility status PT Visit Diagnosis: Unsteadiness on feet (R26.81);Muscle weakness (generalized) (M62.81);Difficulty in walking, not elsewhere classified (R26.2);Pain Pain - Right/Left: Left Pain - part of body: Knee     Time: 8828-0034 PT Time Calculation (min) (ACUTE  ONLY): 14 min  Charges:  $Therapeutic Exercise: 8-22 mins $Therapeutic Activity: 8-22 mins                    [employer], SPT 02/09/2022, 1:55 PM

## 2022-02-09 NOTE — Progress Notes (Signed)
   Subjective: 1 Day Post-Op Procedure(s) (LRB): Left total knee revision, both components (Left) Patient reports pain as mild.   Patient is well, and has had no acute complaints or problems Denies any CP, SOB, ABD pain. We will continue therapy today.   Objective: Vital signs in last 24 hours: Temp:  [97.5 F (36.4 C)-99.6 F (37.6 C)] 98.2 F (36.8 C) (11/07 0739) Pulse Rate:  [58-84] 77 (11/07 0739) Resp:  [15-20] 20 (11/07 0456) BP: (99-146)/(55-72) 112/56 (11/07 0739) SpO2:  [94 %-100 %] 99 % (11/07 0739)  Intake/Output from previous day: 11/06 0701 - 11/07 0700 In: 1252.5 [P.O.:240; I.V.:712.5; IV Piggyback:300] Out: 250 [Urine:150; Blood:100] Intake/Output this shift: No intake/output data recorded.  Recent Labs    02/09/22 0322  HGB 9.4*   Recent Labs    02/09/22 0322  WBC 9.2  RBC 3.34*  HCT 29.4*  PLT 244   Recent Labs    02/09/22 0322  NA 141  K 4.0  CL 109  CO2 25  BUN 19  CREATININE 0.75  GLUCOSE 119*  CALCIUM 8.8*   No results for input(s): "LABPT", "INR" in the last 72 hours.  EXAM General - Patient is Alert, Appropriate, and Oriented Extremity - Neurovascular intact Sensation intact distally Intact pulses distally Dorsiflexion/Plantar flexion intact No cellulitis present Compartment soft Dressing - dressing C/D/I and no drainage Motor Function - intact, moving foot and toes well on exam.   Past Medical History:  Diagnosis Date   Ankle fracture    Diverticulosis    DM (diabetes mellitus), type 2 (HCC)    Hypertension    denies taking medication   Neuropathy    legs   Neuropathy    Rheumatoid arthritis (HCC)     Assessment/Plan:   1 Day Post-Op Procedure(s) (LRB): Left total knee revision, both components (Left) Principal Problem:   S/P revision of total knee, left  Estimated body mass index is 48.72 kg/m as calculated from the following:   Height as of this encounter: 5\' 7"  (1.702 m).   Weight as of this encounter:  141.1 kg. Advance diet Up with therapy Pain well controlled VSS, BP soft. Hold Norvasc this am. Give 500cc bolus Labs stable. Overall patient doing very well this am. Will monitor progress with PT today. Patient with little to no help at home. Patient most likely will need SNF.    DVT Prophylaxis - Lovenox, TED hose, and SCDs Weight-Bearing as tolerated to left leg   T. Rachelle Hora, PA-C [place] 02/09/2022, 7:58 AM

## 2022-02-09 NOTE — Evaluation (Signed)
Occupational Therapy Evaluation Patient Details Name: Ellen Garcia MRN: [id number] DOB: [birth year] Today's Date: 02/09/2022   History of Present Illness 66 yo female s/p left TKA revision. PMH includes HTN, DM, Diverticulosis, OA, RA, TKA, and obesity   Clinical Impression   Pt seen for OT evaluation this date, POD#1 from above surgery. Pt was independent in all ADL prior to surgery and using SPC versus RW for mobility 2/2 knee pain. She lives with her spouse who drives trucks for work and who leaves 11/8. She reports other family can come stay with her but not until 2 weeks from now. Pt has a friend who can come by 1-2x/day as needed. Pt is eager to return to PLOF with less pain and improved safety and independence. Pt currently requires MIN A For LB ADL tasks, SBA-CGA for ADL transfers to recliner, BSC, shower bench, mod indep with pericare after toileting, and SBA for ADL mobility with RW (amb ~125'). Pt instructed in polar care mgt, falls prevention strategies, home/routines modifications, DME/AE for LB bathing and dressing tasks, and OT facilitated problem solving for assist needed for home. Near end of mobility, pt endorsed feeling a bit lightheaded, worsening over time. Seated and noted to be a bit clammy and nauseated. RN notified. Placed in recliner with BLE elevated, SCD on RLE, head reclined. Polar care reconnected. Pt would benefit from skilled OT services including additional instruction in dressing techniques with or without assistive devices for dressing and bathing skills to support recall and carryover prior to discharge and ultimately to maximize safety, independence, and minimize falls risk and caregiver burden. Recommend follow up therapy as arranged by surgeon.    Recommendations for follow up therapy are one component of a multi-disciplinary discharge planning process, led by the attending physician.  Recommendations may be updated based on patient status, additional functional  criteria and insurance authorization.   Follow Up Recommendations  Follow physician's recommendations for discharge plan and follow up therapies    Assistance Recommended at Discharge Intermittent Supervision/Assistance  Patient can return home with the following A little help with walking and/or transfers;A little help with bathing/dressing/bathroom;Assistance with cooking/housework;Assist for transportation;Help with stairs or ramp for entrance    Functional Status Assessment  Patient has had a recent decline in their functional status and demonstrates the ability to make significant improvements in function in a reasonable and predictable amount of time.  Equipment Recommendations  Other (comment) Management consultant)    Recommendations for Other Services       Precautions / Restrictions Precautions Precautions: Knee Precaution Booklet Issued: Yes (comment) Restrictions Weight Bearing Restrictions: Yes LLE Weight Bearing: Weight bearing as tolerated      Mobility Bed Mobility Overal bed mobility: Needs Assistance Bed Mobility: Supine to Sit     Supine to sit: Supervision, HOB elevated     General bed mobility comments: +time/effort    Transfers Overall transfer level: Needs assistance Equipment used: Rolling walker (2 wheels) Transfers: Sit to/from Stand Sit to Stand: Min guard           General transfer comment: VC for hand placement, +time/effort      Balance Overall balance assessment: Needs assistance Sitting-balance support: Feet supported Sitting balance-Leahy Scale: Good     Standing balance support: Bilateral upper extremity supported, Reliant on assistive device for balance Standing balance-Leahy Scale: Fair  ADL either performed or assessed with clinical judgement   ADL Overall ADL's : Needs assistance/impaired                                       General ADL Comments: Pt currently requires  MIN A For LB ADL tasks, SBA-CGA for ADL transfers to recliner/BSC/shower bench, and SBA for ADL mobility (amb ~125').     Vision         Perception     Praxis      Pertinent Vitals/Pain Pain Assessment Pain Assessment: 0-10 Pain Location: L knee at end of session, 7/10 at start of session Pain Descriptors / Indicators: Grimacing, Guarding, Crying, Moaning Pain Intervention(s): Limited activity within patient's tolerance, Monitored during session, Repositioned, Patient requesting pain meds-RN notified, Ice applied, Premedicated before session     Hand Dominance     Extremity/Trunk Assessment Upper Extremity Assessment Upper Extremity Assessment: Overall WFL for tasks assessed   Lower Extremity Assessment Lower Extremity Assessment: Generalized weakness;LLE deficits/detail LLE Deficits / Details: s/p TKA revision   Cervical / Trunk Assessment Cervical / Trunk Assessment: Normal   Communication Communication Communication: No difficulties   Cognition Arousal/Alertness: Awake/alert Behavior During Therapy: WFL for tasks assessed/performed Overall Cognitive Status: Within Functional Limits for tasks assessed                                       General Comments  once in the hall ambulating, pt endorsed feeling a bit lightheaded, worsening over time. Seated and noted to be a bit clammy and nauseated. RN notified. Placed in recliner with BLE elevated, SCD on RLE, head reclined    Exercises Other Exercises Other Exercises: Pt educated in polar care mgt, falls prevention, AE/DME, home/routines modifications, and facilitated problem solving for assist needed upon return home. Handout provided.   Shoulder Instructions      Home Living Family/patient expects to be discharged to:: Private residence Living Arrangements: Spouse/significant other Available Help at Discharge: Family;Friend(s);Available PRN/intermittently (spouse drives trucks and leaves 11/8, other  family can come stay wiht her but not until 2 weeks from now. Has a friend who can come by 1-2x/day for brief visits if needed) Type of Home: House Home Access: Stairs to enter [employer] of Steps: 3 Entrance Stairs-Rails: None Home Layout: Able to live on main level with bedroom/bathroom     Bathroom Shower/Tub: Chief Strategy Officer: Standard Bathroom Accessibility: Yes   Home Equipment: Cane - single Information systems manager (2 wheels);Rollator (4 wheels)          Prior Functioning/Environment Prior Level of Function : Independent/Modified Independent;History of Falls (last six months)             Mobility Comments: Pt reports ambulating with SPC versus RW versus rollator at baseline; endorses 1 fall in past 15mo due to knee issues ADLs Comments: indep        OT Problem List: Decreased strength;Decreased range of motion;Pain;Impaired balance (sitting and/or standing);Decreased knowledge of use of DME or AE      OT Treatment/Interventions: Self-care/ADL training;Therapeutic exercise;Therapeutic activities;DME and/or AE instruction;Patient/family education;Balance training    OT Goals(Current goals can be found in the care plan section) Acute Rehab OT Goals Patient Stated Goal: get stronger at rehab OT Goal Formulation: With patient Time For Goal Achievement: 02/23/22  Potential to Achieve Goals: Good ADL Goals Pt Will Perform Lower Body Dressing: with modified independence;with adaptive equipment;sit to/from stand Pt Will Transfer to Toilet: with modified independence;ambulating (LRAD) Pt Will Perform Toileting - Clothing Manipulation and hygiene: with modified independence Additional ADL Goal #1: Pt will independently instruct caregiver in polar care mgt Additional ADL Goal #2: Pt will independently instruct caregiver in compression stocking mgt.  OT Frequency: Min 2X/week    Co-evaluation              AM-PAC OT "6 Clicks" Daily  Activity     Outcome Measure Help from another person eating meals?: None Help from another person taking care of personal grooming?: None Help from another person toileting, which includes using toliet, bedpan, or urinal?: A Little Help from another person bathing (including washing, rinsing, drying)?: A Little Help from another person to put on and taking off regular upper body clothing?: None Help from another person to put on and taking off regular lower body clothing?: A Little 6 Click Score: 21   End of Session Nurse Communication: Mobility status;Patient requests pain meds;Other (comment) (nauseated)  Activity Tolerance: Treatment limited secondary to medical complications (Comment) (near end of mobility, became lightheaded, nauseated, increased L knee pain, and sweaty, improving with time and reclined in recliner) Patient left: in chair;with call bell/phone within reach;with chair alarm set  OT Visit Diagnosis: Other abnormalities of gait and mobility (R26.89);Muscle weakness (generalized) (M62.81);Pain;History of falling (Z91.81) Pain - Right/Left: Left Pain - part of body: Knee                Time: 0813-0909 OT Time Calculation (min): 56 min Charges:  OT General Charges $OT Visit: 1 Visit OT Evaluation $OT Eval Moderate Complexity: 1 Mod OT Treatments $Self Care/Home Management : 38-52 mins  Arman Filter., MPH, MS, OTR/L ascom [phone] 02/09/22, 10:16 AM

## 2022-02-09 NOTE — Discharge Instructions (Signed)
   Instructions after Total Knee Replacement   Zachary Aberman M.D.     Dept. of Orthopaedics & Sports Medicine  [facility]  [address]  [place], [place]  [address]  Phone: [phone]   Fax: [phone]    DIET: Drink plenty of non-alcoholic fluids. Resume your normal diet. Include foods high in fiber.  ACTIVITY:  You may use crutches or a walker with weight-bearing as tolerated, unless instructed otherwise. You may be weaned off of the walker or crutches by your Physical Therapist.  Do NOT place pillows under the knee. Anything placed under the knee could limit your ability to straighten the knee.   Continue doing gentle exercises. Exercising will reduce the pain and swelling, increase motion, and prevent muscle weakness.   Please continue to use the TED compression stockings for 2 weeks. You may remove the stockings at night, but should reapply them in the morning. Do not drive or operate any equipment until instructed.  WOUND CARE:  Continue to use the PolarCare or ice packs periodically to reduce pain and swelling. You may begin showering 3 days after surgery with honeycomb dressing. Remove honeycomb dressing 7 days after surgery and continue showering. Allow dermabond to fall off on its own.  MEDICATIONS: You may resume your regular medications. Please take the pain medication as prescribed on the medication. Do not take pain medication on an empty stomach. You have been given a prescription for a blood thinner (Lovenox or Coumadin). Please take the medication as instructed. (NOTE: After completing a 2 week course of Lovenox, take one 81 mg Enteric-coated aspirin twice a day. This along with elevation will help reduce the possibility of phlebitis in your operated leg.) Do not drive or drink alcoholic beverages when taking pain medications.  CALL THE OFFICE FOR: Temperature above 101 degrees Excessive bleeding or drainage on the dressing. Excessive swelling,  coldness, or paleness of the toes. Persistent nausea and vomiting.  FOLLOW-UP:  You should have an appointment to return to the office in 14 days after surgery. Arrangements have been made for continuation of Physical Therapy (either home therapy or outpatient therapy).    

## 2022-02-10 LAB — GLUCOSE, CAPILLARY
Glucose-Capillary: 159 mg/dL — ABNORMAL HIGH (ref 70–99)
Glucose-Capillary: 196 mg/dL — ABNORMAL HIGH (ref 70–99)
Glucose-Capillary: 170 mg/dL — ABNORMAL HIGH (ref 70–99)
Glucose-Capillary: 148 mg/dL — ABNORMAL HIGH (ref 70–99)

## 2022-02-10 LAB — CBC
HCT: 26.9 % — ABNORMAL LOW (ref 36.0–46.0)
Hemoglobin: 8.7 g/dL — ABNORMAL LOW (ref 12.0–15.0)
MCH: 28.3 pg (ref 26.0–34.0)
MCHC: 32.3 g/dL (ref 30.0–36.0)
MCV: 87.6 fL (ref 80.0–100.0)
Platelets: 214 10*3/uL (ref 150–400)
RBC: 3.07 MIL/uL — ABNORMAL LOW (ref 3.87–5.11)
RDW: 13.6 % (ref 11.5–15.5)
WBC: 8.2 10*3/uL (ref 4.0–10.5)
nRBC: 0 % (ref 0.0–0.2)

## 2022-02-10 MED ORDER — ACETAMINOPHEN 500 MG PO TABS: 1000.0000 mg | ORAL_TABLET | Freq: Four times a day (QID) | ORAL | 0 refills | Status: AC | PRN

## 2022-02-10 MED ORDER — TAB-A-VITE/IRON PO TABS: 1.0000 | ORAL_TABLET | Freq: Every day | ORAL | 0 refills | Status: DC

## 2022-02-10 MED ORDER — ONDANSETRON HCL 4 MG PO TABS: 4.0000 mg | ORAL_TABLET | Freq: Four times a day (QID) | ORAL | 0 refills | Status: DC | PRN

## 2022-02-10 MED ORDER — ENOXAPARIN SODIUM 40 MG/0.4ML IJ SOSY: 40.0000 mg | PREFILLED_SYRINGE | INTRAMUSCULAR | 0 refills | Status: DC

## 2022-02-10 MED ORDER — OXYCODONE HCL 5 MG PO TABS: 5.0000 mg | ORAL_TABLET | Freq: Four times a day (QID) | ORAL | 0 refills | Status: DC | PRN

## 2022-02-10 MED ORDER — TRAMADOL HCL 50 MG PO TABS: 50.0000 mg | ORAL_TABLET | Freq: Four times a day (QID) | ORAL | 0 refills | Status: DC | PRN

## 2022-02-10 MED ORDER — TAB-A-VITE/IRON PO TABS
1.0000 | ORAL_TABLET | Freq: Every day | ORAL | Status: DC
  Administered 2022-02-10 – 2022-02-11 (×2): 1 via ORAL
  Filled 2022-02-10 (×2): qty 1

## 2022-02-10 MED ORDER — DOCUSATE SODIUM 100 MG PO CAPS: 100.0000 mg | ORAL_CAPSULE | Freq: Two times a day (BID) | ORAL | 0 refills | Status: DC

## 2022-02-10 MED ORDER — CELECOXIB 200 MG PO CAPS: 200.0000 mg | ORAL_CAPSULE | Freq: Two times a day (BID) | ORAL | 0 refills | Status: AC

## 2022-02-10 NOTE — Progress Notes (Addendum)
   Subjective: 2 Days Post-Op Procedure(s) (LRB): Left total knee revision, both components (Left) Patient reports pain as moderate.  Very little oral pain medication over the last 24 hours.  Reports of dizziness, lightheadedness with physical therapy yesterday but currently asymptomatic lying in bed. Patient is well, and has had no acute complaints or problems Denies any CP, SOB, ABD pain. We will continue therapy today.   Objective: Vital signs in last 24 hours: Temp:  [97.8 F (36.6 C)-99.8 F (37.7 C)] 99.8 F (37.7 C) (11/08 0515) Pulse Rate:  [72-80] 80 (11/08 0515) Resp:  [16] 16 (11/08 0515) BP: (122-151)/(56-79) 137/56 (11/08 0515) SpO2:  [96 %-100 %] 97 % (11/08 0515)  Intake/Output from previous day: 11/07 0701 - 11/08 0700 In: 800.9 [I.V.:800.9] Out: -  Intake/Output this shift: No intake/output data recorded.  Recent Labs    02/09/22 0322 02/10/22 0537  HGB 9.4* 8.7*   Recent Labs    02/09/22 0322 02/10/22 0537  WBC 9.2 8.2  RBC 3.34* 3.07*  HCT 29.4* 26.9*  PLT 244 214   Recent Labs    02/09/22 0322  NA 141  K 4.0  CL 109  CO2 25  BUN 19  CREATININE 0.75  GLUCOSE 119*  CALCIUM 8.8*   No results for input(s): "LABPT", "INR" in the last 72 hours.  EXAM General - Patient is Alert, Appropriate, and Oriented Extremity - Neurovascular intact Sensation intact distally Intact pulses distally Dorsiflexion/Plantar flexion intact No cellulitis present Compartment soft Dressing - dressing C/D/I and no drainage Motor Function - intact, moving foot and toes well on exam.   Past Medical History:  Diagnosis Date   Ankle fracture    Diverticulosis    DM (diabetes mellitus), type 2 (HCC)    Hypertension    denies taking medication   Neuropathy    legs   Neuropathy    Rheumatoid arthritis (HCC)     Assessment/Plan:   2 Days Post-Op Procedure(s) (LRB): Left total knee revision, both components (Left) Principal Problem:   S/P revision of  total knee, left  Estimated body mass index is 48.72 kg/m as calculated from the following:   Height as of this encounter: 5\' 7"  (1.702 m).   Weight as of this encounter: 141.1 kg. Advance diet Up with therapy Pain well controlled VSS,  Labs stable. Acute postop blood loss anemia -hemoglobin 8.7, stable.  Start iron supplement. Overall patient doing very well this am.  Vital signs and labs are stable.  Currently asymptomatic.  Encouraged her to increase fluids and discussed pain medication management with nursing.  Will see how she performs with physical therapy today.  Patient may need skilled nursing facility due to lack of help at home and slow progress with PT.    DVT Prophylaxis - Lovenox, TED hose, and SCDs Weight-Bearing as tolerated to left leg   T. , PA-C [facility] 02/10/2022, 8:04 AM  Patient seen and examined, agree with above plan.  The patient is doing well status post left total knee revision, no concerns at this time.  Pain is controlled.  Discussed DVT prophylaxis, pain medication use, and discharge planning with the patient.  All questions answered we will proceed with planning for possible SNF pending PT today.   13/11/2021

## 2022-02-10 NOTE — Progress Notes (Addendum)
Physical Therapy Treatment Patient Details Name: Ellen Garcia MRN: [id number] DOB: 1956/03/10 Today's Date: 02/10/2022   History of Present Illness 66 yo female s/p left TKA revision. PMH includes HTN, DM, Diverticulosis, OA, RA, TKA, and obesity    PT Comments    Pt found in chair upon PT entry with c/o 8/10 pain in left knee s/p TKA revision. Chair to [facility]  stand pivot transfer with CGA and RW. Sit<>Stand with CGA and RW. Pt progressed ambulation with CGA and RW to 130 ft with 2 seated rest breaks. Pt still with some complaints of "wooziness" with all mobility attempts, still displaying decreased activity tolerance. Recommendation at this time updated to SNF, pending progress in AM session. Pt remains motivated, stated now she would like to return home if able.      Recommendations for follow up therapy are one component of a multi-disciplinary discharge planning process, led by the attending physician.  Recommendations may be updated based on patient status, additional functional criteria and insurance authorization.  Follow Up Recommendations  Skilled nursing-short term rehab (<3 hours/day) Can patient physically be transported by private vehicle: Yes   Assistance Recommended at Discharge Intermittent Supervision/Assistance  Patient can return home with the following A little help with walking and/or transfers;A little help with bathing/dressing/bathroom;Assistance with cooking/housework;Assist for transportation;Help with stairs or ramp for entrance   Equipment Recommendations  None recommended by PT    Recommendations for Other Services       Precautions / Restrictions Precautions Precautions: Knee Precaution Booklet Issued: No Precaution Comments: WBAT Restrictions Weight Bearing Restrictions: Yes LLE Weight Bearing: Weight bearing as tolerated     Mobility  Bed Mobility Overal bed mobility: Needs Assistance Bed Mobility: Sit to Supine Rolling: Supervision      Sit to supine: Supervision   General bed mobility comments: extensive time/effort due to pain in L knee pain    Transfers Overall transfer level: Needs assistance Equipment used: Rolling walker (2 wheels) Transfers: Sit to/from Stand Sit to Stand: Min guard           General transfer comment: no VC required for RW safety or mechanics    Ambulation/Gait Ambulation/Gait assistance: Min guard Gait Distance (Feet): 130 Feet Assistive device: Rolling walker (2 wheels) Gait Pattern/deviations: Step-to pattern, Antalgic       General Gait Details: pt relied heavily on RW to take standiing breaks and required 2 seated rest breaks   Stairs             Wheelchair Mobility    Modified Rankin (Stroke Patients Only)       Balance Overall balance assessment: Needs assistance Sitting-balance support: Feet supported Sitting balance-Leahy Scale: Good     Standing balance support: Bilateral upper extremity supported, Reliant on assistive device for balance Standing balance-Leahy Scale: Fair Standing balance comment: pt reported feeling wooziness upon initial stand                            Cognition Arousal/Alertness: Awake/alert Behavior During Therapy: WFL for tasks assessed/performed Overall Cognitive Status: Within Functional Limits for tasks assessed                                          Exercises      General Comments General comments (skin integrity, edema, etc.): Pt reports feeling worsening nausea/dizziness as compared  to yesterday, BP 135/60 (MAP 83) HR 95 seated on edge of bed, 153/83 (MAp 100) HR 102 after short amb tranfser to chair in standing, pt unable to tolerate further progressing mobility at this time due to symptoms, education provided re: importance of progressing mobility/ADL task participation      Pertinent Vitals/Pain Pain Assessment Pain Score: 8  Pain Location: L knee Pain Descriptors / Indicators:  Grimacing, Moaning Pain Intervention(s): Limited activity within patient's tolerance, Repositioned, Ice applied, Patient requesting pain meds-RN notified    Home Living                          Prior Function            PT Goals (current goals can now be found in the care plan section) Acute Rehab PT Goals Patient Stated Goal: to return home PT Goal Formulation: With patient Time For Goal Achievement: 02/22/22 Potential to Achieve Goals: Good Progress towards PT goals: Progressing toward goals    Frequency    BID      PT Plan Discharge plan needs to be updated    Co-evaluation              AM-PAC PT "6 Clicks" Mobility   Outcome Measure  Help needed turning from your back to your side while in a flat bed without using bedrails?: A Little Help needed moving from lying on your back to sitting on the side of a flat bed without using bedrails?: A Little Help needed moving to and from a bed to a chair (including a wheelchair)?: A Little Help needed standing up from a chair using your arms (e.g., wheelchair or bedside chair)?: A Little Help needed to walk in hospital room?: A Little Help needed climbing 3-5 steps with a railing? : A Lot 6 Click Score: 17    End of Session Equipment Utilized During Treatment: Gait belt Activity Tolerance: Patient limited by pain Patient left: in bed;with bed alarm set;with family/visitor present;with call bell/phone within reach Nurse Communication: Mobility status PT Visit Diagnosis: Unsteadiness on feet (R26.81);Muscle weakness (generalized) (M62.81);Difficulty in walking, not elsewhere classified (R26.2);Pain Pain - Right/Left: Left Pain - part of body: Knee     Time: 1346-1415 PT Time Calculation (min) (ACUTE ONLY): 29 min  Charges:  $Therapeutic Exercise: 23-37 mins                        [employer], SPT 02/10/2022, 2:32 PM

## 2022-02-10 NOTE — Discharge Summary (Signed)
Physician Discharge Summary  Patient ID: Ellen Garcia MRN: [id number] DOB/AGE: 66-Mar-1957 66 y.o.  Admit date: 02/08/2022 Discharge date: 02/11/2022  Admission Diagnoses:  S/P revision of total knee, left [Z96.652]   Discharge Diagnoses: Patient Active Problem List   Diagnosis Date Noted   S/P revision of total knee, left 02/08/2022   Primary localized osteoarthritis of left knee 02/01/2017    Past Medical History:  Diagnosis Date   Ankle fracture    Diverticulosis    DM (diabetes mellitus), type 2 (HCC)    Hypertension    denies taking medication   Neuropathy    legs   Neuropathy    Rheumatoid arthritis (HCC)      Transfusion: none   Consultants (if any):   Discharged Condition: Improved  Hospital Course: Ellen Garcia is an 66 y.o. female who was admitted 02/08/2022 with a diagnosis of S/P revision of total knee, left and went to the operating room on 02/08/2022 and underwent the above named procedures.    Surgeries: Procedure(s): Left total knee revision, both components on 02/08/2022 Patient tolerated the surgery well. Taken to PACU where she was stabilized and then transferred to the orthopedic floor.  Started on Lovenox 30 mg q 12 hrs. TEDs and SCDs applied bilaterally. Heels elevated on bed. No evidence of DVT. Negative Homan. Physical therapy started on day #1 for gait training and transfer. OT started day #1 for ADL and assisted devices.  Slow progress of physical therapy on postop day 1.  Vital signs stable.  Slight drop in hemoglobin on postop day 1 and 2.  She is started on iron supplement postop day 2.  On postop day 2, vital signs stable, patient asymptomatic. Patient's IV was d/c on day #2. Patient was able to safely and independently complete all PT goals. PT recommending discharge to home.  On post op day #3 patient was stable and ready for discharge to home with HHPT.  Implants: Femur Persona Revision Size 9 with 60mmx135mm 53mm offset stem and 14mm  posterior lateral augment   Tibia Persona revision Size F base plate with [id number] stem and medial 69mm augment   Poly 66mm CPS  central tibia cone size X-small    She was given perioperative antibiotics:  Anti-infectives (From admission, onward)    Start     Dose/Rate Route Frequency Ordered Stop   02/08/22 1415  ceFAZolin (ANCEF) IVPB 3g/100 mL premix        3 g 200 mL/hr over 30 Minutes Intravenous Every 6 hours 02/08/22 1326 02/08/22 2111   02/08/22 0600  ceFAZolin (ANCEF) IVPB 3g/100 mL premix        3 g 200 mL/hr over 30 Minutes Intravenous On call to O.R. 02/08/22 5284 02/08/22 0824     .  She was given sequential compression devices, early ambulation, and Lovenox, teds for DVT prophylaxis.  She benefited maximally from the hospital stay and there were no complications.    Recent vital signs:  Vitals:   02/10/22 2125 02/11/22 0733  BP: (!) 149/68 (!) 141/65  Pulse: 87 87  Resp: 20 15  Temp: 99.6 F (37.6 C) 98.1 F (36.7 C)  SpO2: 100% 96%    Recent laboratory studies:  Lab Results  Component Value Date   HGB 8.7 (L) 02/10/2022   HGB 9.4 (L) 02/09/2022   HGB 11.3 (L) 01/28/2022   Lab Results  Component Value Date   WBC 8.2 02/10/2022   PLT 214 02/10/2022   Lab Results  Component Value Date   INR 0.94 01/19/2017   Lab Results  Component Value Date   NA 141 02/09/2022   K 4.0 02/09/2022   CL 109 02/09/2022   CO2 25 02/09/2022   BUN 19 02/09/2022   CREATININE 0.75 02/09/2022   GLUCOSE 119 (H) 02/09/2022    Discharge Medications:   Allergies as of 02/11/2022       Reactions   Hydrochlorothiazide Other (See Comments)   Caused hair loss   Penicillins Hives, Other (See Comments)   Has patient had a PCN reaction causing immediate rash, facial/tongue/throat swelling, SOB or lightheadedness with hypotension: Yes Has patient had a PCN reaction causing severe rash involving mucus membranes or skin necrosis: Yes Has patient had a PCN reaction that  required hospitalization: No Has patient had a PCN reaction occurring within the last 10 years: No If all of the above answers are "NO", then may proceed with Cephalosporin use.   Zanaflex [tizanidine] Hives   Lisinopril Rash, Other (See Comments)   Caused rash on hands        Medication List     STOP taking these medications    ibuprofen 800 MG tablet Commonly known as: ADVIL       TAKE these medications    acetaminophen 500 MG tablet Commonly known as: TYLENOL Take 2 tablets (1,000 mg total) by mouth every 6 (six) hours as needed.   amLODipine 5 MG tablet Commonly known as: NORVASC Take 5 mg by mouth every morning.   BIOTIN PO Take 1 tablet by mouth daily at 6 (six) AM.   celecoxib 200 MG capsule Commonly known as: CeleBREX Take 1 capsule (200 mg total) by mouth 2 (two) times daily for 14 days.   cyclobenzaprine 5 MG tablet Commonly known as: FLEXERIL Take 5 mg by mouth at bedtime.   diphenhydrAMINE 25 mg capsule Commonly known as: BENADRYL Take 50 mg by mouth at bedtime as needed for allergies or sleep.   docusate sodium 100 MG capsule Commonly known as: COLACE Take 1 capsule (100 mg total) by mouth 2 (two) times daily.   enoxaparin 40 MG/0.4ML injection Commonly known as: LOVENOX Inject 0.4 mLs (40 mg total) into the skin daily for 14 days.   gabapentin 300 MG capsule Commonly known as: NEURONTIN Take 300 mg by mouth 3 (three) times daily.   glipiZIDE 5 MG tablet Commonly known as: GLUCOTROL Take 5 mg by mouth daily before breakfast.   GREEN TEA PO Take 500 mg by mouth 2 (two) times daily.   Humira Pen 40 MG/0.4ML Pnkt Generic drug: Adalimumab Inject 40 mg into the skin See admin instructions. Every 10 days   multivitamin tablet Take 1 tablet by mouth daily.   multivitamins with iron Tabs tablet Take 1 tablet by mouth daily.   ondansetron 4 MG tablet Commonly known as: ZOFRAN Take 1 tablet (4 mg total) by mouth every 6 (six) hours as  needed for nausea.   oxyCODONE 5 MG immediate release tablet Commonly known as: Roxicodone Take 0.5-1 tablets (2.5-5 mg total) by mouth every 6 (six) hours as needed for severe pain.   pioglitazone 30 MG tablet Commonly known as: ACTOS Take 30 mg by mouth every morning.   traMADol 50 MG tablet Commonly known as: ULTRAM Take 1 tablet (50 mg total) by mouth every 6 (six) hours as needed for moderate pain.        Diagnostic Studies: DG Tibia/Fibula Left  Result Date: 02/08/2022 CLINICAL DATA:  Status post  left knee revision. EXAM: LEFT TIBIA AND FIBULA - 2 VIEW COMPARISON:  CT examination dated January 13, 2022 FINDINGS: Status post revision of the left knee arthroplasty. There are long stem femoral and tibial components. Subcutaneous emphysema as expected. IMPRESSION: Status post revision of the left knee arthroplasty. Electronically Signed   By: Larose Hires D.O.   On: 02/08/2022 12:30   DG Tibia/Fibula Left Port  Result Date: 02/08/2022 CLINICAL DATA:  Knee replacement. EXAM: PORTABLE LEFT TIBIA AND FIBULA - 2 VIEW COMPARISON:  February 01, 2017.  January 15, 2022. FINDINGS: Two intraoperative cross-table lateral projections were obtained of the left lower leg. These images demonstrate placement of long-stem implant in the proximal tibia. IMPRESSION: Intraoperative images as described above. Electronically Signed   By: Lupita Raider M.D.   On: 02/08/2022 10:39   CT KNEE LEFT WO CONTRAST  Result Date: 01/15/2022 CLINICAL DATA:  Limited ability to walk. Total knee arthroplasty in 2018. EXAM: CT OF THE LEFT KNEE WITHOUT CONTRAST TECHNIQUE: Multidetector CT imaging of the left knee was performed according to the standard protocol. Multiplanar CT image reconstructions were also generated. RADIATION DOSE REDUCTION: This exam was performed according to the departmental dose-optimization program which includes automated exposure control, adjustment of the mA and/or kV according to patient size  and/or use of iterative reconstruction technique. COMPARISON:  01/06/2017 FINDINGS: Bones/Joint/Cartilage Left total knee arthroplasty with the margin artifact partially obscuring the adjacent soft tissue and osseous structures. Lucency around the tibial stem component consistent with loosening with the distal tip breaching the medial cortex of the tibial diametaphysis. No periarticular fluid collection. No acute fracture or dislocation. Generalized osteopenia. No joint effusion. Ligaments Ligaments are suboptimally evaluated by CT. Muscles and Tendons Muscles are normal. No muscle atrophy. No intramuscular fluid collection or hematoma. Quadriceps tendon and patellar tendon are intact. Soft tissue No fluid collection or hematoma.  No soft tissue mass. IMPRESSION: 1. Left total knee arthroplasty with the margin artifact partially obscuring the adjacent soft tissue and osseous structures. Lucency around the tibial stem component consistent with loosening with the distal tip breaching the medial cortex of the tibial diametaphysis. Electronically Signed   By: Elige Ko M.D.   On: 01/15/2022 06:18    Disposition:      Follow-up Information     Evon Slack, PA-C Follow up in 2 week(s).   Specialties: Orthopedic Surgery, Emergency Medicine Contact information: [address] [place] [address] [phone]                  Signed: Patience Musca 02/11/2022, 10:13 AM

## 2022-02-10 NOTE — Anesthesia Postprocedure Evaluation (Signed)
Anesthesia Post Note  Patient: Ellen Garcia  Procedure(s) Performed: Left total knee revision, both components (Left: Knee)  Patient location during evaluation: Nursing Unit Anesthesia Type: Spinal Level of consciousness: oriented and awake and alert Pain management: pain level controlled Vital Signs Assessment: post-procedure vital signs reviewed and stable Respiratory status: spontaneous breathing and respiratory function stable Cardiovascular status: blood pressure returned to baseline and stable Postop Assessment: no headache, no backache, no apparent nausea or vomiting and patient able to bend at knees Anesthetic complications: no  No notable events documented.   Last Vitals:  Vitals:   02/10/22 0111 02/10/22 0515  BP: (!) 151/79 (!) 137/56  Pulse: 78 80  Resp: 16 16  Temp: 37.1 C 37.7 C  SpO2: 100% 97%    Last Pain:  Vitals:   02/09/22 2141  TempSrc:   PainSc: 2                  Starling Manns

## 2022-02-10 NOTE — Progress Notes (Signed)
Physical Therapy Treatment Patient Details Name: Ellen Garcia MRN: [id number] DOB: [birth year] Today's Date: 02/10/2022   History of Present Illness 66 yo female s/p left TKA revision. PMH includes HTN, DM, Diverticulosis, OA, RA, TKA, and obesity    PT Comments    Pt found in chair upon PT entry with c/o 6/10 L knee pain s/p TKA revision. Sit<>Stand with RW, CGA, and no vc cues required for hand placement. Pt ambulated 181ft with RW, CGA, and 3 seated rest breaks due to fatigue. Blood pressure and O2 stats monitored throughout session and remained WNL. Pt required mod encouragement to participate in mobility session. Pt would benefit from skilled physical therapy to address the listed deficits (see below) to increase independence with ADLs and function. Current recommendation is to follow physician's orders for D/C.      Recommendations for follow up therapy are one component of a multi-disciplinary discharge planning process, led by the attending physician.  Recommendations may be updated based on patient status, additional functional criteria and insurance authorization.  Follow Up Recommendations  Follow physician's recommendations for discharge plan and follow up therapies Can patient physically be transported by private vehicle: Yes   Assistance Recommended at Discharge Intermittent Supervision/Assistance  Patient can return home with the following A little help with walking and/or transfers;A little help with bathing/dressing/bathroom;Assistance with cooking/housework;Assist for transportation;Help with stairs or ramp for entrance   Equipment Recommendations  None recommended by PT    Recommendations for Other Services       Precautions / Restrictions Precautions Precautions: Knee Precaution Booklet Issued: No Precaution Comments: WBAT Restrictions Weight Bearing Restrictions: Yes LLE Weight Bearing: Weight bearing as tolerated     Mobility  Bed Mobility                     Transfers Overall transfer level: Needs assistance Equipment used: Rolling walker (2 wheels) Transfers: Sit to/from Stand Sit to Stand: Min guard           General transfer comment: no VC required for RW safety or mechanics    Ambulation/Gait Ambulation/Gait assistance: Min guard Gait Distance (Feet): 110 Feet Assistive device: Rolling walker (2 wheels) Gait Pattern/deviations: Step-to pattern, Antalgic       General Gait Details: pt relied heavily on RW to take standiing breaks and additionally required 3 seated rest breaks   Stairs             Wheelchair Mobility    Modified Rankin (Stroke Patients Only)       Balance Overall balance assessment: Needs assistance Sitting-balance support: Feet supported Sitting balance-Leahy Scale: Good     Standing balance support: Bilateral upper extremity supported, Reliant on assistive device for balance Standing balance-Leahy Scale: Fair                              Cognition Arousal/Alertness: Awake/alert Behavior During Therapy: WFL for tasks assessed/performed Overall Cognitive Status: Within Functional Limits for tasks assessed                                          Exercises      General Comments General comments (skin integrity, edema, etc.): Pt reports feeling worsening nausea/dizziness as compared to yesterday, BP 135/60 (MAP 83) HR 95 seated on edge of bed, 153/83 (MAp 100) HR 102 after  short amb tranfser to chair in standing, pt unable to tolerate further progressing mobility at this time due to symptoms, education provided re: importance of progressing mobility/ADL task participation      Pertinent Vitals/Pain Pain Assessment Pain Score: 6  Pain Location: L knee Pain Descriptors / Indicators: Grimacing, Guarding, Moaning Pain Intervention(s): Monitored during session, Premedicated before session, Ice applied    Home Living                           Prior Function            PT Goals (current goals can now be found in the care plan section) Acute Rehab PT Goals Patient Stated Goal: to go to SNF PT Goal Formulation: With patient Time For Goal Achievement: 02/22/22 Potential to Achieve Goals: Good Progress towards PT goals: Progressing toward goals    Frequency    BID      PT Plan Current plan remains appropriate    Co-evaluation              AM-PAC PT "6 Clicks" Mobility   Outcome Measure  Help needed turning from your back to your side while in a flat bed without using bedrails?: A Little Help needed moving from lying on your back to sitting on the side of a flat bed without using bedrails?: A Little Help needed moving to and from a bed to a chair (including a wheelchair)?: A Little Help needed standing up from a chair using your arms (e.g., wheelchair or bedside chair)?: A Little Help needed to walk in hospital room?: A Little Help needed climbing 3-5 steps with a railing? : A Lot 6 Click Score: 17    End of Session Equipment Utilized During Treatment: Gait belt Activity Tolerance: Patient limited by pain;Patient limited by fatigue Patient left: in chair;with chair alarm set;with call bell/phone within reach Nurse Communication: Mobility status PT Visit Diagnosis: Unsteadiness on feet (R26.81);Muscle weakness (generalized) (M62.81);Difficulty in walking, not elsewhere classified (R26.2);Pain Pain - Right/Left: Left Pain - part of body: Knee     Time: 0258-5277 PT Time Calculation (min) (ACUTE ONLY): 28 min  Charges:                        Tresa Endo O'Daniel, SPT 02/10/2022, 11:53 AM

## 2022-02-10 NOTE — Progress Notes (Signed)
Occupational Therapy Treatment Patient Details Name: Ellen Garcia MRN: [id number] DOB: 05/23/1955 Today's Date: 02/10/2022   History of present illness 66 yo female s/p left TKA revision. PMH includes HTN, DM, Diverticulosis, OA, RA, TKA, and obesity   OT comments  Chart reviewed to date, pt greeted in bed agreeable to OT tx session. Pt endorses feeling "worse" than yesterday in regards to dizziness/nausea. Tx session targeted improving functional activity tolerance in order to facilitate increased ease and performance of ADL tasks. Supervision required for all mobility however requires increased time, effort. P BP 135/60 (MAP 83) HR 95 seated on edge of bed, 153/83 (MAp 100) HR 102 after short amb tranfser to chair in standing, pt unable to tolerate further progressing mobility at this time due to symptoms, education provided re: importance of progressing mobility/ADL task participation.Pt in agreement, OT will continue to follow acutely.    Recommendations for follow up therapy are one component of a multi-disciplinary discharge planning process, led by the attending physician.  Recommendations may be updated based on patient status, additional functional criteria and insurance authorization.    Follow Up Recommendations  Follow physician's recommendations for discharge plan and follow up therapies    Assistance Recommended at Discharge Intermittent Supervision/Assistance  Patient can return home with the following  A little help with walking and/or transfers;A little help with bathing/dressing/bathroom;Assistance with cooking/housework;Assist for transportation;Help with stairs or ramp for entrance   Equipment Recommendations  Other (comment) Lexicographer)    Recommendations for Other Services      Precautions / Restrictions Precautions Precautions: Knee Restrictions Weight Bearing Restrictions: Yes LLE Weight Bearing: Weight bearing as tolerated       Mobility Bed  Mobility Overal bed mobility: Needs Assistance Bed Mobility: Supine to Sit Rolling: Supervision         General bed mobility comments: extensive time/effort due to pt feeling dizzy/naseous but no hands on/physical assist required    Transfers Overall transfer level: Needs assistance Equipment used: Rolling walker (2 wheels) Transfers: Sit to/from Stand Sit to Stand: Supervision                 Balance Overall balance assessment: Needs assistance Sitting-balance support: Feet supported Sitting balance-Leahy Scale: Good     Standing balance support: Bilateral upper extremity supported, Reliant on assistive device for balance Standing balance-Leahy Scale: Fair                             ADL either performed or assessed with clinical judgement   ADL Overall ADL's : Needs assistance/impaired Eating/Feeding: Set up;Sitting   Grooming: Wash/dry face;Sitting;Set up               Lower Body Dressing: Maximal assistance   Toilet Transfer: Supervision/safety;Rolling walker (2 wheels) Toilet Transfer Details (indicate cue type and reason): simulated, short amb transfer with RW                Extremity/Trunk Assessment Upper Extremity Assessment Upper Extremity Assessment: Overall WFL for tasks assessed   Lower Extremity Assessment Lower Extremity Assessment: Generalized weakness;LLE deficits/detail LLE Deficits / Details: s/p TKA revision   Cervical / Trunk Assessment Cervical / Trunk Assessment: Normal    Vision       Perception     Praxis      Cognition Arousal/Alertness: Awake/alert Behavior During Therapy: WFL for tasks assessed/performed Overall Cognitive Status: Within Functional Limits for tasks assessed  Exercises      Shoulder Instructions       General Comments Pt reports feeling worsening nausea/dizziness as compared to yesterday, BP 135/60 (MAP 83) HR 95  seated on edge of bed, 153/83 (MAp 100) HR 102 after short amb tranfser to chair in standing, pt unable to tolerate further progressing mobility at this time due to symptoms, education provided re: importance of progressing mobility/ADL task participation    Pertinent Vitals/ Pain       Pain Assessment Pain Assessment: 0-10 Pain Score: 6  Pain Location: L knee Pain Descriptors / Indicators: Grimacing, Guarding, Moaning Pain Intervention(s): Limited activity within patient's tolerance, Monitored during session, Premedicated before session, Repositioned, Ice applied  Home Living                                          Prior Functioning/Environment              Frequency  Min 2X/week        Progress Toward Goals  OT Goals(current goals can now be found in the care plan section)  Progress towards OT goals: Progressing toward goals     Plan Discharge plan remains appropriate    Co-evaluation                 AM-PAC OT "6 Clicks" Daily Activity     Outcome Measure   Help from another person eating meals?: None Help from another person taking care of personal grooming?: None Help from another person toileting, which includes using toliet, bedpan, or urinal?: A Little Help from another person bathing (including washing, rinsing, drying)?: A Little Help from another person to put on and taking off regular upper body clothing?: None Help from another person to put on and taking off regular lower body clothing?: A Lot 6 Click Score: 20    End of Session Equipment Utilized During Treatment: Rolling walker (2 wheels)  OT Visit Diagnosis: Other abnormalities of gait and mobility (R26.89);Muscle weakness (generalized) (M62.81);Pain;History of falling (Z91.81)   Activity Tolerance Treatment limited secondary to medical complications (Comment)   Patient Left in chair;with call bell/phone within reach;with chair alarm set   Nurse Communication Mobility  status;Other (comment) (performance)        Time: 9449-6759 OT Time Calculation (min): 29 min  Charges: OT General Charges $OT Visit: 1 Visit OT Treatments $Self Care/Home Management : 8-22 mins $Therapeutic Activity: 8-22 mins  Oleta Mouse, OTD OTR/L  02/10/22, 11:15 AM

## 2022-02-11 LAB — GLUCOSE, CAPILLARY
Glucose-Capillary: 149 mg/dL — ABNORMAL HIGH (ref 70–99)
Glucose-Capillary: 141 mg/dL — ABNORMAL HIGH (ref 70–99)
Glucose-Capillary: 226 mg/dL — ABNORMAL HIGH (ref 70–99)

## 2022-02-11 MED ORDER — CELECOXIB 200 MG PO CAPS
200.0000 mg | ORAL_CAPSULE | Freq: Two times a day (BID) | ORAL | Status: DC
  Administered 2022-02-11: 200 mg via ORAL
  Filled 2022-02-11: qty 1

## 2022-02-11 MED ORDER — TRAMADOL HCL 50 MG PO TABS: 50.0000 mg | ORAL_TABLET | Freq: Four times a day (QID) | ORAL | 0 refills | Status: DC | PRN

## 2022-02-11 MED ORDER — OXYCODONE HCL 5 MG PO TABS: 2.5000 mg | ORAL_TABLET | Freq: Four times a day (QID) | ORAL | 0 refills | Status: DC | PRN

## 2022-02-11 NOTE — TOC Progression Note (Signed)
Transition of Care [facility]) - Progression Note    Patient Details  Name: Ellen Garcia MRN: [id number] Date of Birth: 1955/07/21  Transition of Care [facility]) CM/SW Contact  Truddie Hidden, RN Phone Number: 02/11/2022, 11:45 AM  Clinical Narrative:    Sent message to [facility] representative  to advised of patient discharge today.    Expected Discharge Plan: Home w Home Health Services Barriers to Discharge: Barriers Resolved  Expected Discharge Plan and Services Expected Discharge Plan: Home w Home Health Services   Discharge Planning Services: CM Consult   Living arrangements for the past 2 months: Single Family Home Expected Discharge Date: 02/11/22               DME Arranged: N/A DME Agency: NA       HH Arranged: PT HH Agency: [facility] Date HH Agency Contacted: 02/09/22 Time HH Agency Contacted: [phone] Representative spoke with at [facility] Agency: [place]   Social Determinants of Health (SDOH) Interventions    Readmission Risk Interventions     No data to display

## 2022-02-11 NOTE — Progress Notes (Signed)
Discharge instructions given to patient. Patient verbalizes understanding. Patient family providing transportation. Patient wheeled out by staff.

## 2022-02-11 NOTE — Progress Notes (Signed)
Occupational Therapy Treatment Patient Details Name: Ellen Garcia MRN: [id number] DOB: [birth year] Today's Date: 02/11/2022   History of present illness 66 yo female s/p left TKA revision. PMH includes HTN, DM, Diverticulosis, OA, RA, TKA, and obesity   OT comments  Pt seen for OT tx this date. Pt in recliner, endorsing eagerness to discharge home later today. OT facilitated problem solving and additional instruction in return to home and ADL routines and home modifications to maximize safety and independence during and after recovery. Pt verbalized understanding and denied additional questions/concerns. Pt continues to require some assist for LB ADL tasks and pt endorses she has a friend who has worked in healthcare before who can assist. Pt endorsing increased confidence in safe return home. Recommend HHOT to maximize return to PLOF.    Recommendations for follow up therapy are one component of a multi-disciplinary discharge planning process, led by the attending physician.  Recommendations may be updated based on patient status, additional functional criteria and insurance authorization.    Follow Up Recommendations  Home health OT    Assistance Recommended at Discharge Intermittent Supervision/Assistance  Patient can return home with the following  A little help with walking and/or transfers;A little help with bathing/dressing/bathroom;Assistance with cooking/housework;Assist for transportation;Help with stairs or ramp for entrance   Equipment Recommendations  Other (comment) Lexicographer)    Recommendations for Other Services      Precautions / Restrictions Precautions Precautions: Knee Restrictions Weight Bearing Restrictions: Yes LLE Weight Bearing: Weight bearing as tolerated       Mobility Bed Mobility                    Transfers                         Balance                                           ADL either performed or  assessed with clinical judgement   ADL Overall ADL's : Needs assistance/impaired                                       General ADL Comments: Pt continues to require MIN A For LB ADL    Extremity/Trunk Assessment              Vision       Perception     Praxis      Cognition Arousal/Alertness: Awake/alert Behavior During Therapy: WFL for tasks assessed/performed Overall Cognitive Status: Within Functional Limits for tasks assessed                                          Exercises Other Exercises Other Exercises: OT facilitated problem solving and additional instruction in return to home and ADL routines and home modifications to maximize safety and independence during and after recovery. Pt verbalized understanding.    Shoulder Instructions       General Comments      Pertinent Vitals/ Pain          Home Living  Prior Functioning/Environment              Frequency  Min 2X/week        Progress Toward Goals  OT Goals(current goals can now be found in the care plan section)  Progress towards OT goals: Progressing toward goals  Acute Rehab OT Goals Patient Stated Goal: go home OT Goal Formulation: With patient Time For Goal Achievement: 02/23/22 Potential to Achieve Goals: Good  Plan Discharge plan needs to be updated;Frequency remains appropriate    Co-evaluation                 AM-PAC OT "6 Clicks" Daily Activity     Outcome Measure   Help from another person eating meals?: None Help from another person taking care of personal grooming?: None Help from another person toileting, which includes using toliet, bedpan, or urinal?: A Little Help from another person bathing (including washing, rinsing, drying)?: A Little Help from another person to put on and taking off regular upper body clothing?: None Help from another person to put on and  taking off regular lower body clothing?: A Lot 6 Click Score: 20    End of Session    OT Visit Diagnosis: Other abnormalities of gait and mobility (R26.89);Muscle weakness (generalized) (M62.81);Pain;History of falling (Z91.81) Pain - Right/Left: Left Pain - part of body: Knee   Activity Tolerance Patient tolerated treatment well   Patient Left in chair;with call bell/phone within reach;with SCD's reapplied;Other (comment) (polar care in place)   Nurse Communication          Time: 1110-1120 OT Time Calculation (min): 10 min  Charges: OT General Charges $OT Visit: 1 Visit OT Treatments $Self Care/Home Management : 8-22 mins  Arman Filter., MPH, MS, OTR/L ascom [phone] 02/11/22, 11:51 AM

## 2022-02-11 NOTE — Progress Notes (Signed)
Physical Therapy Treatment Patient Details Name: Ellen Garcia MRN: [id number] DOB: 1955-10-03 Today's Date: 02/11/2022   History of Present Illness 66 yo female s/p left TKA revision. PMH includes HTN, DM, Diverticulosis, OA, RA, TKA, and obesity    PT Comments    Pt found supine in bed upon PT entry with c/o 6/10 pain in left knee s/p TKA revision. Supine<>Sit CGA. Sit<>Stand with CGA and RW. Pt ambulated 110 ft with two standing rest breaks. Pt ascended/descended 4 steps backwards with RW, CGA, and min assist for RW management. Pt would benefit from skilled physical therapy to address the listed deficits (see below) to increase independence with ADLs and function. Due to progression in mobility and stair education, current recommendation is updated to HHPT with intermittent assistance to return pt to PLOF.       Recommendations for follow up therapy are one component of a multi-disciplinary discharge planning process, led by the attending physician.  Recommendations may be updated based on patient status, additional functional criteria and insurance authorization.  Follow Up Recommendations  Home health PT Can patient physically be transported by private vehicle: Yes   Assistance Recommended at Discharge Intermittent Supervision/Assistance  Patient can return home with the following A little help with walking and/or transfers;A little help with bathing/dressing/bathroom;Assistance with cooking/housework;Assist for transportation;Help with stairs or ramp for entrance   Equipment Recommendations  None recommended by PT    Recommendations for Other Services       Precautions / Restrictions Precautions Precautions: Knee Precaution Booklet Issued: No Precaution Comments: WBAT Restrictions Weight Bearing Restrictions: Yes LLE Weight Bearing: Weight bearing as tolerated     Mobility  Bed Mobility Overal bed mobility: Needs Assistance Bed Mobility: Supine to Sit     Supine  to sit: HOB elevated, Min guard          Transfers Overall transfer level: Needs assistance Equipment used: Rolling walker (2 wheels) Transfers: Sit to/from Stand Sit to Stand: Min guard                Ambulation/Gait Ambulation/Gait assistance: Min guard Gait Distance (Feet): 110 Feet Assistive device: Rolling walker (2 wheels) Gait Pattern/deviations: Step-to pattern, Antalgic       General Gait Details: pt relied heavily on RW and took 2 standing rest breaks   Stairs Stairs: Yes Stairs assistance: Min guard Stair Management: No rails, Backwards, With walker Number of Stairs: 4 General stair comments: no LOB   Wheelchair Mobility    Modified Rankin (Stroke Patients Only)       Balance Overall balance assessment: Needs assistance Sitting-balance support: Feet supported Sitting balance-Leahy Scale: Good     Standing balance support: Bilateral upper extremity supported, Reliant on assistive device for balance Standing balance-Leahy Scale: Fair Standing balance comment: no wooziness today                            Cognition Arousal/Alertness: Awake/alert Behavior During Therapy: WFL for tasks assessed/performed Overall Cognitive Status: Within Functional Limits for tasks assessed                                          Exercises      General Comments        Pertinent Vitals/Pain Pain Assessment Pain Score: 6  Pain Location: L knee Pain Descriptors / Indicators: Grimacing, Moaning Pain  Intervention(s): Repositioned, Ice applied, Monitored during session, Premedicated before session    Home Living Family/patient expects to be discharged to:: Private residence Living Arrangements: Spouse/significant other Available Help at Discharge: Family;Friend(s);Available PRN/intermittently Type of Home: House Home Access: Stairs to enter Entrance Stairs-Rails: None Entrance Stairs-Number of Steps: 3   Home Layout:  Able to live on main level with bedroom/bathroom Home Equipment: Cane - single Information systems manager (2 wheels);Rollator (4 wheels)      Prior Function            PT Goals (current goals can now be found in the care plan section) Acute Rehab PT Goals Patient Stated Goal: to return home PT Goal Formulation: With patient Time For Goal Achievement: 02/22/22 Potential to Achieve Goals: Good Progress towards PT goals: Progressing toward goals    Frequency    BID      PT Plan Discharge plan needs to be updated    Co-evaluation              AM-PAC PT "6 Clicks" Mobility   Outcome Measure  Help needed turning from your back to your side while in a flat bed without using bedrails?: A Little Help needed moving from lying on your back to sitting on the side of a flat bed without using bedrails?: A Little Help needed moving to and from a bed to a chair (including a wheelchair)?: A Little Help needed standing up from a chair using your arms (e.g., wheelchair or bedside chair)?: A Little Help needed to walk in hospital room?: A Little Help needed climbing 3-5 steps with a railing? : A Lot 6 Click Score: 17    End of Session Equipment Utilized During Treatment: Gait belt Activity Tolerance: Patient tolerated treatment well Patient left: in chair;with chair alarm set;with call bell/phone within reach;with family/visitor present Nurse Communication: Mobility status PT Visit Diagnosis: Unsteadiness on feet (R26.81);Muscle weakness (generalized) (M62.81);Difficulty in walking, not elsewhere classified (R26.2);Pain Pain - Right/Left: Left Pain - part of body: Knee     Time: 1012-1052 PT Time Calculation (min) (ACUTE ONLY): 40 min  Charges:                        Tresa Endo O'Daniel, SPT  02/11/2022, 12:16 PM

## 2022-02-11 NOTE — Care Management Important Message (Signed)
Important Message  Patient Details  Name: Ellen Garcia MRN: [id number] Date of Birth: 1956-01-12   Medicare Important Message Given:  N/A - LOS <3 / Initial given by admissions     Olegario Messier A Kristain Filo 02/11/2022, 8:45 AM

## 2022-02-11 NOTE — Progress Notes (Signed)
   Subjective: 3 Days Post-Op Procedure(s) (LRB): Left total knee revision, both components (Left) Patient reports pain as mild.  No reports of dizziness or lightheadedness this morning.  Patient feels confident in being able to complete stairs today and going home. Patient is well, and has had no acute complaints or problems Denies any CP, SOB, ABD pain. We will continue therapy today.   Objective: Vital signs in last 24 hours: Temp:  [98.1 F (36.7 C)-99.6 F (37.6 C)] 98.1 F (36.7 C) (11/09 0733) Pulse Rate:  [73-87] 87 (11/09 0733) Resp:  [15-20] 15 (11/09 0733) BP: (141-183)/(65-81) 141/65 (11/09 0733) SpO2:  [96 %-100 %] 96 % (11/09 0733)  Intake/Output from previous day: No intake/output data recorded. Intake/Output this shift: No intake/output data recorded.  Recent Labs    02/09/22 0322 02/10/22 0537  HGB 9.4* 8.7*   Recent Labs    02/09/22 0322 02/10/22 0537  WBC 9.2 8.2  RBC 3.34* 3.07*  HCT 29.4* 26.9*  PLT 244 214   Recent Labs    02/09/22 0322  NA 141  K 4.0  CL 109  CO2 25  BUN 19  CREATININE 0.75  GLUCOSE 119*  CALCIUM 8.8*   No results for input(s): "LABPT", "INR" in the last 72 hours.  EXAM General - Patient is Alert, Appropriate, and Oriented Extremity - Neurovascular intact Sensation intact distally Intact pulses distally Dorsiflexion/Plantar flexion intact No cellulitis present Compartment soft Dressing - dressing C/D/I and no drainage Motor Function - intact, moving foot and toes well on exam.   Past Medical History:  Diagnosis Date   Ankle fracture    Diverticulosis    DM (diabetes mellitus), type 2 (HCC)    Hypertension    denies taking medication   Neuropathy    legs   Neuropathy    Rheumatoid arthritis (HCC)     Assessment/Plan:   3 Days Post-Op Procedure(s) (LRB): Left total knee revision, both components (Left) Principal Problem:   S/P revision of total knee, left  Estimated body mass index is 48.72 kg/m  as calculated from the following:   Height as of this encounter: 5\' 7"  (1.702 m).   Weight as of this encounter: 141.1 kg. Advance diet Up with therapy Pain well controlled VSS,  Labs stable. Acute postop blood loss anemia -hemoglobin 8.7, stable.  Continue with iron supplement. Care management to assist with discharge to home with home health PT today pending completion of PT goals.   DVT Prophylaxis - Lovenox, TED hose, and SCDs Weight-Bearing as tolerated to left leg   T. , PA-C [facility] 02/11/2022, 9:46 AM

## 2022-02-11 NOTE — Progress Notes (Addendum)
Physical Therapy Treatment Patient Details Name: Ellen Garcia MRN: [id number] DOB: 1955-09-11 Today's Date: 02/11/2022   History of Present Illness 66 yo female s/p left TKA revision. PMH includes HTN, DM, Diverticulosis, OA, RA, TKA, and obesity    PT Comments    Pt found in chair upon PT entry with c/o 6/10 pain in left knee s/p TKA revision. Chair stand pivot transfer to [facility] with RW and CGA. Sit<>Stand with RW and CGA. Pt ambulated 120 ft with RW, CGA, and chair follow. Pt required one standing rest break during ambulation and maintained standing static balance without UE support during clothing management. Pt would benefit from skilled physical therapy to address the listed deficits (see below) to increase independence with ADLs and function. Current recommendation is HHPT with intermittent assist to return pt to PLOF.     Recommendations for follow up therapy are one component of a multi-disciplinary discharge planning process, led by the attending physician.  Recommendations may be updated based on patient status, additional functional criteria and insurance authorization.  Follow Up Recommendations  Home health PT Can patient physically be transported by private vehicle: Yes   Assistance Recommended at Discharge Intermittent Supervision/Assistance  Patient can return home with the following A little help with walking and/or transfers;A little help with bathing/dressing/bathroom;Assistance with cooking/housework;Assist for transportation;Help with stairs or ramp for entrance   Equipment Recommendations  None recommended by PT    Recommendations for Other Services       Precautions / Restrictions Precautions Precautions: Knee Precaution Booklet Issued: No Precaution Comments: WBAT Restrictions Weight Bearing Restrictions: Yes LLE Weight Bearing: Weight bearing as tolerated     Mobility  Bed Mobility Overal bed mobility: Needs Assistance Bed Mobility: Supine to Sit      Supine to sit: HOB elevated, Min guard          Transfers Overall transfer level: Needs assistance Equipment used: Rolling walker (2 wheels) Transfers: Sit to/from Stand Sit to Stand: Min guard           General transfer comment: no VC required for RW safety or mechanics    Ambulation/Gait Ambulation/Gait assistance: Min guard Gait Distance (Feet): 120 Feet Assistive device: Rolling walker (2 wheels) Gait Pattern/deviations: Step-to pattern, Antalgic       General Gait Details: only 1 standing rest break required   Stairs Stairs: Yes Stairs assistance: Min guard Stair Management: No rails, Backwards, With walker Number of Stairs: 4 General stair comments: no LOB   Wheelchair Mobility    Modified Rankin (Stroke Patients Only)       Balance Overall balance assessment: Needs assistance Sitting-balance support: Feet supported Sitting balance-Leahy Scale: Good     Standing balance support: Bilateral upper extremity supported, Reliant on assistive device for balance Standing balance-Leahy Scale: Fair Standing balance comment: no light headedness during mobility                            Cognition Arousal/Alertness: Awake/alert Behavior During Therapy: WFL for tasks assessed/performed Overall Cognitive Status: Within Functional Limits for tasks assessed                                          Exercises Total Joint Exercises Goniometric ROM: approx -3 degrees extension. approx 70 degrees flexion. pt gaurds knee making it difficult to obtain    General Comments  Pertinent Vitals/Pain Pain Assessment Pain Score: 6  Pain Location: L knee Pain Descriptors / Indicators: Grimacing, Moaning Pain Intervention(s): Repositioned, Ice applied, Premedicated before session    Home Living Family/patient expects to be discharged to:: Private residence Living Arrangements: Spouse/significant other Available Help at  Discharge: Family;Friend(s);Available PRN/intermittently Type of Home: House Home Access: Stairs to enter Entrance Stairs-Rails: None Entrance Stairs-Number of Steps: 3   Home Layout: Able to live on main level with bedroom/bathroom Home Equipment: Cane - single Information systems manager (2 wheels);Rollator (4 wheels)      Prior Function            PT Goals (current goals can now be found in the care plan section) Acute Rehab PT Goals Patient Stated Goal: to return home PT Goal Formulation: With patient Time For Goal Achievement: 02/22/22 Potential to Achieve Goals: Good Progress towards PT goals: Progressing toward goals    Frequency    BID      PT Plan Current plan remains appropriate    Co-evaluation              AM-PAC PT "6 Clicks" Mobility   Outcome Measure  Help needed turning from your back to your side while in a flat bed without using bedrails?: A Little Help needed moving from lying on your back to sitting on the side of a flat bed without using bedrails?: A Little Help needed moving to and from a bed to a chair (including a wheelchair)?: A Little Help needed standing up from a chair using your arms (e.g., wheelchair or bedside chair)?: A Little Help needed to walk in hospital room?: A Little Help needed climbing 3-5 steps with a railing? : A Lot 6 Click Score: 17    End of Session Equipment Utilized During Treatment: Gait belt Activity Tolerance: Patient tolerated treatment well Patient left: in bed;with bed alarm set;with call bell/phone within reach Nurse Communication: Mobility status PT Visit Diagnosis: Unsteadiness on feet (R26.81);Muscle weakness (generalized) (M62.81);Difficulty in walking, not elsewhere classified (R26.2);Pain Pain - Right/Left: Left Pain - part of body: Knee     Time: 0147-0206 PT Time Calculation (min) (ACUTE ONLY): 19 min  Charges:  $Gait Training: 23-37 mins $Therapeutic Exercise: 8-22 mins $Therapeutic  Activity: 8-22 mins                     [employer], SPT  02/11/2022, 3:08 PM

## 2022-02-13 LAB — AEROBIC/ANAEROBIC CULTURE W GRAM STAIN (SURGICAL/DEEP WOUND)
Culture: NO GROWTH
Culture: NO GROWTH
Culture: NO GROWTH
Gram Stain: NONE SEEN
Gram Stain: NONE SEEN
Gram Stain: NONE SEEN

## 2022-04-16 ENCOUNTER — Emergency Department
Admission: EM | Admit: 2022-04-16 | Discharge: 2022-04-16 | Disposition: A | Discharge status: Home / Self Care | Payer: Medicare Other | Attending: Emergency Medicine | Admitting: Emergency Medicine

## 2022-04-16 ENCOUNTER — Other Ambulatory Visit: Payer: Self-pay

## 2022-04-16 ENCOUNTER — Emergency Department (HOSPITAL_BASED_OUTPATIENT_CLINIC_OR_DEPARTMENT_OTHER)
Admit: 2022-04-16 | Discharge: 2022-04-16 | Disposition: A | Discharge status: Home / Self Care | Payer: Medicare Other | Attending: Cardiovascular Disease | Admitting: Cardiovascular Disease

## 2022-04-16 ENCOUNTER — Emergency Department: Payer: Medicare Other

## 2022-04-16 DIAGNOSIS — I010 Acute rheumatic pericarditis: Secondary | ICD-10-CM | POA: Insufficient documentation

## 2022-04-16 DIAGNOSIS — I3139 Other pericardial effusion (noninflammatory): Secondary | ICD-10-CM

## 2022-04-16 DIAGNOSIS — I10 Essential (primary) hypertension: Secondary | ICD-10-CM | POA: Insufficient documentation

## 2022-04-16 DIAGNOSIS — Z20822 Contact with and (suspected) exposure to covid-19: Secondary | ICD-10-CM | POA: Insufficient documentation

## 2022-04-16 DIAGNOSIS — R0602 Shortness of breath: Secondary | ICD-10-CM

## 2022-04-16 DIAGNOSIS — R079 Chest pain, unspecified: Secondary | ICD-10-CM | POA: Diagnosis present

## 2022-04-16 DIAGNOSIS — M069 Rheumatoid arthritis, unspecified: Secondary | ICD-10-CM

## 2022-04-16 DIAGNOSIS — E119 Type 2 diabetes mellitus without complications: Secondary | ICD-10-CM | POA: Insufficient documentation

## 2022-04-16 LAB — CBC
HCT: 32.2 % — ABNORMAL LOW (ref 36.0–46.0)
Hemoglobin: 10.1 g/dL — ABNORMAL LOW (ref 12.0–15.0)
MCH: 26.6 pg (ref 26.0–34.0)
MCHC: 31.4 g/dL (ref 30.0–36.0)
MCV: 85 fL (ref 80.0–100.0)
Platelets: 484 10*3/uL — ABNORMAL HIGH (ref 150–400)
RBC: 3.79 MIL/uL — ABNORMAL LOW (ref 3.87–5.11)
RDW: 14.8 % (ref 11.5–15.5)
WBC: 7.9 10*3/uL (ref 4.0–10.5)
nRBC: 0 % (ref 0.0–0.2)

## 2022-04-16 LAB — RESP PANEL BY RT-PCR (RSV, FLU A&B, COVID)  RVPGX2
Influenza A by PCR: NEGATIVE
Influenza B by PCR: NEGATIVE
Resp Syncytial Virus by PCR: NEGATIVE
SARS Coronavirus 2 by RT PCR: NEGATIVE

## 2022-04-16 LAB — ECHOCARDIOGRAM COMPLETE
AR max vel: 2.08 cm2
AV Area VTI: 2.52 cm2
AV Area mean vel: 1.91 cm2
AV Mean grad: 4 mmHg
AV Peak grad: 8 mmHg
Ao pk vel: 1.41 m/s
Area-P 1/2: 3.05 cm2
S' Lateral: 3.4 cm

## 2022-04-16 LAB — BASIC METABOLIC PANEL
Anion gap: 11 (ref 5–15)
BUN: 9 mg/dL (ref 8–23)
CO2: 28 mmol/L (ref 22–32)
Calcium: 8.8 mg/dL — ABNORMAL LOW (ref 8.9–10.3)
Chloride: 101 mmol/L (ref 98–111)
Creatinine, Ser: 0.71 mg/dL (ref 0.44–1.00)
GFR, Estimated: >60 mL/min (ref >60)
Glucose, Bld: 198 mg/dL — ABNORMAL HIGH (ref 70–99)
Potassium: 3.7 mmol/L (ref 3.5–5.1)
Sodium: 140 mmol/L (ref 135–145)

## 2022-04-16 LAB — D-DIMER, QUANTITATIVE: D-Dimer, Quant: 9.02 ug/mL-FEU — ABNORMAL HIGH (ref 0.00–0.50)

## 2022-04-16 LAB — TROPONIN I (HIGH SENSITIVITY): Troponin I (High Sensitivity): 6 ng/L (ref <18)

## 2022-04-16 MED ORDER — LACTATED RINGERS IV BOLUS
1000.0000 mL | Freq: Once | INTRAVENOUS | Status: AC
  Administered 2022-04-16: 1000 mL via INTRAVENOUS

## 2022-04-16 MED ORDER — ALBUTEROL SULFATE HFA 108 (90 BASE) MCG/ACT IN AERS: 2.0000 | INHALATION_SPRAY | Freq: Four times a day (QID) | RESPIRATORY_TRACT | 0 refills | Status: DC | PRN

## 2022-04-16 MED ORDER — COLCHICINE 0.6 MG PO TABS: 0.6000 mg | ORAL_TABLET | Freq: Two times a day (BID) | ORAL | 0 refills | Status: DC

## 2022-04-16 MED ORDER — IOHEXOL 350 MG/ML SOLN
75.0000 mL | Freq: Once | INTRAVENOUS | Status: AC | PRN
  Administered 2022-04-16: 75 mL via INTRAVENOUS

## 2022-04-16 NOTE — ED Provider Notes (Signed)
[facility] Provider Note    Event Date/Time   First MD Initiated Contact with Patient 04/16/22 [phone]     (approximate)   History   Chief Complaint Chest Pain and Cough   HPI  Ellen Garcia is a 67 y.o. female with past medical history of hypertension, diabetes, and rheumatoid arthritis who presents to the ED complaining of chest pain and cough.  Patient reports that for about 2 weeks she has been feeling weak and fatigued with a nonproductive cough.  She has now began experiencing pain in the center of her chest which she describes as sharp and worse when she coughs.  She has been feeling slightly short of breath, but has not noticed any pain or swelling in her legs.  She did undergo total knee replacement in November of last year.  She denies any fevers, does report she was exposed to people sick with similar symptoms around the time of onset 2 weeks ago.  She continues to take Humira for her rheumatoid arthritis.     Physical Exam   Triage Vital Signs: ED Triage Vitals  Enc Vitals Group     BP 04/16/22 0916 (!) 151/83     Pulse Rate 04/16/22 0916 (!) 108     Resp 04/16/22 0916 19     Temp 04/16/22 0916 99.6 F (37.6 C)     Temp Source 04/16/22 0916 Oral     SpO2 04/16/22 0916 97 %     Weight --      Height --      Head Circumference --      Peak Flow --      Pain Score 04/16/22 0917 5     Pain Loc --      Pain Edu? --      Excl. in [place]? --     Most recent vital signs: Vitals:   04/16/22 0942 04/16/22 1257  BP: 122/66 (!) 153/97  Pulse: (!) 104 97  Resp: (!) 30 (!) 30  Temp:    SpO2: 96% 93%    Constitutional: Alert and oriented. Eyes: Conjunctivae are normal. Head: Atraumatic. Nose: No congestion/rhinnorhea. Mouth/Throat: Mucous membranes are moist.  Cardiovascular: Tachycardic, regular rhythm. Grossly normal heart sounds.  2+ radial pulses bilaterally. Respiratory: Normal respiratory effort.  No retractions. Lungs  CTAB. Gastrointestinal: Soft and nontender. No distention. Musculoskeletal: No lower extremity tenderness nor edema.  Neurologic:  Normal speech and language. No gross focal neurologic deficits are appreciated.    ED Results / Procedures / Treatments   Labs (all labs ordered are listed, but only abnormal results are displayed) Labs Reviewed  BASIC METABOLIC PANEL - Abnormal; Notable for the following components:      Result Value   Glucose, Bld 198 (*)    Calcium 8.8 (*)    All other components within normal limits  CBC - Abnormal; Notable for the following components:   RBC 3.79 (*)    Hemoglobin 10.1 (*)    HCT 32.2 (*)    Platelets 484 (*)    All other components within normal limits  D-DIMER, QUANTITATIVE (NOT AT [facility]) - Abnormal; Notable for the following components:   D-Dimer, Quant 9.02 (*)    All other components within normal limits  RESP PANEL BY RT-PCR (RSV, FLU A&B, COVID)  RVPGX2  TROPONIN I (HIGH SENSITIVITY)     EKG  ED ECG REPORT I, Blake Divine, the attending physician, personally viewed and interpreted this ECG.   Date:  04/16/2022  EKG Time: 9:19  Rate: 111  Rhythm: sinus tachycardia  Axis: LAD  Intervals:none  ST&T Change: None  RADIOLOGY Chest x-ray reviewed and interpreted by me with no infiltrate, edema, or effusion.  PROCEDURES:  Critical Care performed: No  Procedures   MEDICATIONS ORDERED IN ED: Medications  lactated ringers bolus 1,000 mL (0 mLs Intravenous Stopped 04/16/22 1256)  iohexol (OMNIPAQUE) 350 MG/ML injection 75 mL (75 mLs Intravenous Contrast Given 04/16/22 1232)     IMPRESSION / MDM / ASSESSMENT AND PLAN / ED COURSE  I reviewed the triage vital signs and the nursing notes.                              67 y.o. female with past medical history of hypertension, diabetes, and rheumatoid arthritis who presents to the ED complaining of about 2 weeks of weakness and fatigue associated with dry cough, now with increasing  chest pain and shortness of breath.  Patient's presentation is most consistent with acute presentation with potential threat to life or bodily function.  Differential diagnosis includes, but is not limited to, ACS, PE, pneumonia, pneumothorax, dissection, bronchitis, COPD, CHF, COVID-19, influenza.  Patient nontoxic-appearing and in no acute distress, vital signs remarkable for tachycardia and mild tachypnea, however she is breathing comfortably on room air and maintaining oxygen saturations at 96%.  EKG shows no evidence of arrhythmia or ischemia and troponin within normal limits, low suspicion for ACS at this time given atypical symptoms.  Chest x-ray shows likely atelectasis, but low threshold for treatment with antibiotics and this immunocompromise patient on Humira.  Will perform testing for COVID-19 and influenza, also check D-dimer given her recent orthopedic procedure.  Additional labs are reassuring with stable anemia, no significant leukocytosis, electrolyte abnormality, or AKI noted.  D-dimer significantly elevated, CTA of chest is negative for PE but does show moderately sized pericardial effusion with signs of pericarditis.  Findings discussed with Dr. Rockey Situ of cardiology, who arranged for echocardiogram here in the ED.  This shows relatively small pericardial effusion with no evidence of tamponade and he recommends close outpatient follow-up with a course of colchicine.  Patient was ambulated on a pulse ox and maintain oxygen saturations in the mid 90s.  She is appropriate for discharge home with close cardiology follow-up and will be prescribed colchicine.  She was counseled to return to the ED for new or worsening symptoms, patient agrees with plan.      FINAL CLINICAL IMPRESSION(S) / ED DIAGNOSES   Final diagnoses:  Shortness of breath  Acute pericarditis associated with rheumatoid arthritis ([place])  Pericardial effusion     Rx / DC Orders   ED Discharge Orders           Ordered    colchicine 0.6 MG tablet  2 times daily        04/16/22 1514    albuterol (VENTOLIN HFA) 108 (90 Base) MCG/ACT inhaler  Every 6 hours PRN       Note to Pharmacy: Please supply with spacer   04/16/22 1514    Ambulatory referral to Cardiology        04/16/22 1514             Note:  This document was prepared using Dragon voice recognition software and may include unintentional dictation errors.   Blake Divine, MD 04/16/22 [phone]

## 2022-04-16 NOTE — ED Notes (Signed)
Pt ambulated to bathroom and appears SOB. O2 sat stayed in mid 90's RA. HR in mid 80s

## 2022-04-16 NOTE — Progress Notes (Signed)
*  PRELIMINARY RESULTS* Echocardiogram 2D Echocardiogram has been performed.  Ellen Garcia 04/16/2022, 2:21 PM

## 2022-04-16 NOTE — ED Triage Notes (Signed)
Pt presents to ED with /co of CP and cough for the past 2-3 weeks. Pt states it seems to get better than go away. Pt denies cardiac HX. Pt is A&Ox4.

## 2022-04-16 NOTE — ED Notes (Signed)
Pt verbalizes understanding of discharge instructions. Opportunity for questioning and answers were provided. Pt discharged from ED to home by self. Offered pt wheelchair out, pt refused. Pt reports that she will call and schedule follow-up appointment with cardiologist.

## 2022-05-12 ENCOUNTER — Encounter: Payer: Self-pay | Admitting: Cardiology

## 2022-05-12 ENCOUNTER — Ambulatory Visit: Payer: Medicare Other | Attending: Cardiology | Admitting: Cardiology

## 2022-05-12 VITALS — BP 175/93 | HR 83 | Ht 67.0 in | Wt 298.0 lb

## 2022-05-12 DIAGNOSIS — I10 Essential (primary) hypertension: Secondary | ICD-10-CM

## 2022-05-12 DIAGNOSIS — I3139 Other pericardial effusion (noninflammatory): Secondary | ICD-10-CM | POA: Diagnosis not present

## 2022-05-12 MED ORDER — LOSARTAN POTASSIUM 25 MG PO TABS: 25.0000 mg | ORAL_TABLET | Freq: Every day | ORAL | 3 refills | Status: DC

## 2022-05-12 NOTE — Patient Instructions (Signed)
Medication Instructions:   START Losartan - take one tablet (25 mg) by mouth daily.   *If you need a refill on your cardiac medications before your next appointment, please call your pharmacy*   Lab Work:  None Ordered  If you have labs (blood work) drawn today and your tests are completely normal, you will receive your results only by: [place] (if you have MyChart) OR A paper copy in the mail If you have any lab test that is abnormal or we need to change your treatment, we will call you to review the results.   Testing/Procedures:  None ordered   Follow-Up: At [facility], you and your health needs are our priority.  As part of our continuing mission to provide you with exceptional heart care, we have created designated Provider Care Teams.  These Care Teams include your primary Cardiologist (physician) and Advanced Practice Providers (APPs -  Physician Assistants and Nurse Practitioners) who all work together to provide you with the care you need, when you need it.  We recommend signing up for the patient portal called "MyChart".  Sign up information is provided on this After Visit Summary.  MyChart is used to connect with patients for Virtual Visits (Telemedicine).  Patients are able to view lab/test results, encounter notes, upcoming appointments, etc.  Non-urgent messages can be sent to your provider as well.   To learn more about what you can do with MyChart, go to [email or web address].    Your next appointment:   6 week(s)  Provider:   You may see Kate Sable, MD or one of the following Advanced Practice Providers on your designated Care Team:   Murray Hodgkins, NP Christell Faith, PA-C Cadence Kathlen Mody, PA-C Gerrie Nordmann, NP

## 2022-05-12 NOTE — Progress Notes (Signed)
Cardiology Office Note:    Date:  05/12/2022   ID:  [name], DOB Aug 02, 1955, MRN [id number]  PCP:  Baxter Hire, MD   [facility] Cardiologist:  Kate Sable, MD     Referring MD: Blake Divine, MD   Chief Complaint  Patient presents with   Other    Acute pericarditis c/o elevated BP. Meds reviewed verbally with pt.    History of Present Illness:    Ellen Garcia is a 67 y.o. female with a hx of hypertension, diabetes, rheumatoid arthritis who presents with possible pericardial effusion and hypertension.  Patient felt nauseous and dizzy after eating a fish sandwich at [employer].  Symptoms persisted for 3 days prompting her to go to the emergency room.  Also had nonspecific chest pain.  Presented to the ED 04/16/2022 with symptoms of chest pain.  D-dimers were elevated.  Chest CT showed moderate-sized pericardial effusion.  Echocardiogram obtained 04/16/2022 showed normal systolic function EF 60 to 65%, small posterolateral pericardial effusion, no evidence for tamponade.  EKG shows sinus tachycardia with no ST changes to suggest pericarditis.  Colchicine x 14 days course recommended for possible pericarditis.  Symptoms improved/resolved after 3 to 5 days.  She completed colchicine course.  Has not had any further episodes or symptoms.  Has a wrist cuff at home, states blood pressure is usually controlled.  Past Medical History:  Diagnosis Date   Ankle fracture    Diverticulosis    DM (diabetes mellitus), type 2 ([place])    Hypertension    denies taking medication   Neuropathy    legs   Neuropathy    Rheumatoid arthritis ([place])     Past Surgical History:  Procedure Laterality Date   ABDOMINAL HYSTERECTOMY     BREAST CYST EXCISION Left    COLONOSCOPY WITH PROPOFOL N/A 12/26/2018   Procedure: COLONOSCOPY WITH PROPOFOL;  Surgeon: Toledo, Benay Pike, MD;  Location: [facility] ENDOSCOPY;  Service: Gastroenterology;  Laterality: N/A;   CYST  EXCISION Right    upper arm   NECK MASS EXCISION     ORIF ANKLE FRACTURE Left 09/09/2017   Procedure: OPEN REDUCTION INTERNAL FIXATION (ORIF) ANKLE FRACTURE-TRIMALLEOLAR;  Surgeon: Samara Deist, DPM;  Location: [facility] ORS;  Service: Podiatry;  Laterality: Left;   TOTAL KNEE ARTHROPLASTY Left 02/01/2017   Procedure: TOTAL KNEE ARTHROPLASTY;  Surgeon: Hessie Knows, MD;  Location: [facility] ORS;  Service: Orthopedics;  Laterality: Left;   TOTAL KNEE REVISION Left 02/08/2022   Procedure: Left total knee revision, both components;  Surgeon: [name], MD;  Location: [facility] ORS;  Service: Orthopedics;  Laterality: Left;    Current Medications: Current Meds  Medication Sig   acetaminophen (TYLENOL) 500 MG tablet Take 2 tablets (1,000 mg total) by mouth every 6 (six) hours as needed.   Adalimumab (HUMIRA PEN) 40 MG/0.4ML PNKT Inject 40 mg into the skin See admin instructions. Every 10 days   albuterol (VENTOLIN HFA) 108 (90 Base) MCG/ACT inhaler Inhale 2 puffs into the lungs every 6 (six) hours as needed for wheezing or shortness of breath.   amLODipine (NORVASC) 5 MG tablet Take 5 mg by mouth every morning.   BIOTIN PO Take 1 tablet by mouth daily at 6 (six) AM.   cyclobenzaprine (FLEXERIL) 5 MG tablet Take 5 mg by mouth at bedtime.   diphenhydrAMINE (BENADRYL) 25 mg capsule Take 50 mg by mouth at bedtime as needed for allergies or sleep.   gabapentin (NEURONTIN) 300 MG capsule Take 300 mg  by mouth 3 (three) times daily.   glipiZIDE (GLUCOTROL) 5 MG tablet Take 5 mg by mouth daily before breakfast.   Green Tea, Camellia sinensis, (GREEN TEA PO) Take 500 mg by mouth 2 (two) times daily.   losartan (COZAAR) 25 MG tablet Take 1 tablet (25 mg total) by mouth daily.   Multiple Vitamin (MULTIVITAMIN) tablet Take 1 tablet by mouth daily.   pioglitazone (ACTOS) 30 MG tablet Take 30 mg by mouth every morning.     Allergies:   Hydrochlorothiazide, Penicillins, Zanaflex [tizanidine], and Lisinopril    Social History   Socioeconomic History   Marital status: Married    Spouse name: Not on file   Number of children: Not on file   Years of education: Not on file   Highest education level: Not on file  Occupational History   Not on file  Tobacco Use   Smoking status: Former    Packs/day: 0.25    Years: 5.00    Total pack years: 1.25    Types: Cigarettes    Quit date: 09/08/2006    Years since quitting: 15.6   Smokeless tobacco: Never  Vaping Use   Vaping Use: Never used  Substance and Sexual Activity   Alcohol use: Not Currently    Alcohol/week: 3.0 standard drinks of alcohol    Types: 3 Shots of liquor per week    Comment: 1 beer occ   Drug use: No   Sexual activity: Not on file  Other Topics Concern   Not on file  Social History Narrative   Not on file   Social Determinants of Health   Financial Resource Strain: Not on file  Food Insecurity: No Food Insecurity (02/08/2022)   Hunger Vital Sign    Worried About Running Out of Food in the Last Year: Never true    Ran Out of Food in the Last Year: Never true  Transportation Needs: No Transportation Needs (02/08/2022)   PRAPARE - Hydrologist (Medical): No    Lack of Transportation (Non-Medical): No  Physical Activity: Not on file  Stress: Not on file  Social Connections: Not on file     Family History: The patient's family history includes Heart failure in her mother.  ROS:   Please see the history of present illness.     All other systems reviewed and are negative.  EKGs/Labs/Other Studies Reviewed:    The following studies were reviewed today:   EKG:  EKG is  ordered today.  The ekg ordered today demonstrates normal sinus rhythm, heart rate 83  Recent Labs: 04/16/2022: BUN 9; Creatinine, Ser 0.71; Hemoglobin 10.1; Platelets 484; Potassium 3.7; Sodium 140  Recent Lipid Panel No results found for: "CHOL", "TRIG", "HDL", "CHOLHDL", "VLDL", "LDLCALC", "LDLDIRECT"   Risk  Assessment/Calculations:     HYPERTENSION CONTROL Vitals:   05/12/22 0939 05/12/22 0946  BP: (!) 173/92 (!) 175/93    The patient's blood pressure is elevated above target today.  In order to address the patient's elevated BP: A new medication was prescribed today.            Physical Exam:    VS:  BP (!) 175/93 (BP Location: Right Wrist, Patient Position: Sitting, Cuff Size: Large)   Pulse 83   Ht 5\' 7"  (1.702 m)   Wt 298 lb (135.2 kg)   SpO2 93%   BMI 46.67 kg/m     Wt Readings from Last 3 Encounters:  05/12/22 298 lb (135.2 kg)  02/08/22 (!) 311 lb 1.1 oz (141.1 kg)  01/28/22 (!) 311 lb 1.1 oz (141.1 kg)     GEN:  Well nourished, well developed in no acute distress HEENT: Normal NECK: No JVD; No carotid bruits CARDIAC: RRR, no murmurs, rubs, gallops RESPIRATORY:  Clear to auscultation without rales, wheezing or rhonchi  ABDOMEN: Soft, non-tender, non-distended MUSCULOSKELETAL:  No edema; No deformity  SKIN: Warm and dry NEUROLOGIC:  Alert and oriented x 3 PSYCHIATRIC:  Normal affect   ASSESSMENT:    1. Pericardial effusion   2. Primary hypertension    PLAN:    In order of problems listed above:  Trace pericardial effusion, EF 60%, no evidence for tamponade.  Her symptoms were not consistent with pericarditis, she has rheumatoid arthritis, pericardial effusion has been associated with this.  Symptoms of nausea likely came from food poisoning.  Denies chest pain, nausea, dizziness. Hypertension, BP elevated.  Start losartan 25 mg daily, continue Norvasc 5 mg daily.   Follow-up in 6 weeks.     Medication Adjustments/Labs and Tests Ordered: Current medicines are reviewed at length with the patient today.  Concerns regarding medicines are outlined above.  Orders Placed This Encounter  Procedures   EKG 12-Lead   Meds ordered this encounter  Medications   losartan (COZAAR) 25 MG tablet    Sig: Take 1 tablet (25 mg total) by mouth daily.    Dispense:   90 tablet    Refill:  3    Patient Instructions  Medication Instructions:   START Losartan - take one tablet (25 mg) by mouth daily.   *If you need a refill on your cardiac medications before your next appointment, please call your pharmacy*   Lab Work:  None Ordered  If you have labs (blood work) drawn today and your tests are completely normal, you will receive your results only by: [place] (if you have MyChart) OR A paper copy in the mail If you have any lab test that is abnormal or we need to change your treatment, we will call you to review the results.   Testing/Procedures:  None ordered   Follow-Up: At [facility], you and your health needs are our priority.  As part of our continuing mission to provide you with exceptional heart care, we have created designated Provider Care Teams.  These Care Teams include your primary Cardiologist (physician) and Advanced Practice Providers (APPs -  Physician Assistants and Nurse Practitioners) who all work together to provide you with the care you need, when you need it.  We recommend signing up for the patient portal called "MyChart".  Sign up information is provided on this After Visit Summary.  MyChart is used to connect with patients for Virtual Visits (Telemedicine).  Patients are able to view lab/test results, encounter notes, upcoming appointments, etc.  Non-urgent messages can be sent to your provider as well.   To learn more about what you can do with MyChart, go to [email or web address].    Your next appointment:   6 week(s)  Provider:   You may see Kate Sable, MD or one of the following Advanced Practice Providers on your designated Care Team:   Murray Hodgkins, NP Christell Faith, PA-C Cadence Kathlen Mody, PA-C Gerrie Nordmann, NP    Signed, Kate Sable, MD  05/12/2022 11:26 AM    Ellen Garcia

## 2022-06-23 ENCOUNTER — Ambulatory Visit: Payer: Medicare Other | Admitting: Cardiology

## 2022-06-30 ENCOUNTER — Encounter: Payer: Self-pay | Admitting: Cardiology

## 2022-06-30 ENCOUNTER — Ambulatory Visit: Payer: Medicare Other | Attending: Cardiology | Admitting: Cardiology

## 2022-06-30 VITALS — BP 138/82 | HR 95 | Ht 67.0 in | Wt 309.8 lb

## 2022-06-30 DIAGNOSIS — I10 Essential (primary) hypertension: Secondary | ICD-10-CM

## 2022-06-30 MED ORDER — LOSARTAN POTASSIUM 50 MG PO TABS: 50.0000 mg | ORAL_TABLET | Freq: Every day | ORAL | 0 refills | Status: AC

## 2022-06-30 NOTE — Progress Notes (Signed)
Cardiology Office Note:    Date:  06/30/2022   ID:  [name], DOB Dec 09, 1955, MRN [id number]  PCP:  Baxter Hire, MD   [place] [facility] Cardiologist:  Kate Sable, MD     Referring MD: Baxter Hire, MD   Chief Complaint  Patient presents with   Follow-up    6 week f/u, reports having good pressure readings at home    History of Present Illness:    Ellen Garcia is a 67 y.o. female with a hx of hypertension, diabetes, rheumatoid arthritis who presents for follow-up.  Previously seen for hypertension.  Losartan was started, Norvasc was continued.  Systolic blood pressure much improved from prior, ranges in the mid 130s at home.  Compliant with losartan and Norvasc as prescribed, no adverse medication effects.  Prior notes Echocardiogram 04/16/2022 EF 60 to 65%, small posterolateral pericardial effusion, no evidence for tamponade.   Past Medical History:  Diagnosis Date   Ankle fracture    Diverticulosis    DM (diabetes mellitus), type 2 ([place])    Hypertension    denies taking medication   Neuropathy    legs   Neuropathy    Rheumatoid arthritis ([place])     Past Surgical History:  Procedure Laterality Date   ABDOMINAL HYSTERECTOMY     BREAST CYST EXCISION Left    COLONOSCOPY WITH PROPOFOL N/A 12/26/2018   Procedure: COLONOSCOPY WITH PROPOFOL;  Surgeon: Toledo, Benay Pike, MD;  Location: [facility];  Service: Gastroenterology;  Laterality: N/A;   CYST EXCISION Right    upper arm   NECK MASS EXCISION     ORIF ANKLE FRACTURE Left 09/09/2017   Procedure: OPEN REDUCTION INTERNAL FIXATION (ORIF) ANKLE FRACTURE-TRIMALLEOLAR;  Surgeon: Samara Deist, DPM;  Location: [facility] ORS;  Service: Podiatry;  Laterality: Left;   TOTAL KNEE ARTHROPLASTY Left 02/01/2017   Procedure: TOTAL KNEE ARTHROPLASTY;  Surgeon: Hessie Knows, MD;  Location: [facility] ORS;  Service: Orthopedics;  Laterality: Left;   TOTAL KNEE REVISION Left 02/08/2022   Procedure:  Left total knee revision, both components;  Surgeon: [name], MD;  Location: [facility] ORS;  Service: Orthopedics;  Laterality: Left;    Current Medications: Current Meds  Medication Sig   acetaminophen (TYLENOL) 500 MG tablet Take 2 tablets (1,000 mg total) by mouth every 6 (six) hours as needed.   Adalimumab (HUMIRA PEN) 40 MG/0.4ML PNKT Inject 40 mg into the skin See admin instructions. Every 10 days   albuterol (VENTOLIN HFA) 108 (90 Base) MCG/ACT inhaler Inhale 2 puffs into the lungs every 6 (six) hours as needed for wheezing or shortness of breath.   amLODipine (NORVASC) 5 MG tablet Take 5 mg by mouth every morning.   BIOTIN PO Take 1 tablet by mouth daily at 6 (six) AM.   cyclobenzaprine (FLEXERIL) 5 MG tablet Take 5 mg by mouth at bedtime.   diphenhydrAMINE (BENADRYL) 25 mg capsule Take 50 mg by mouth at bedtime as needed for allergies or sleep.   gabapentin (NEURONTIN) 300 MG capsule Take 300 mg by mouth 3 (three) times daily.   glipiZIDE (GLUCOTROL) 5 MG tablet Take 5 mg by mouth daily before breakfast.   Green Tea, Camellia sinensis, (GREEN TEA PO) Take 500 mg by mouth 2 (two) times daily.   leflunomide (ARAVA) 10 MG tablet Take 10 mg by mouth daily.   Multiple Vitamin (MULTIVITAMIN) tablet Take 1 tablet by mouth daily.   pioglitazone (ACTOS) 30 MG tablet Take 30 mg by mouth every morning.   [  DISCONTINUED] losartan (COZAAR) 25 MG tablet Take 1 tablet (25 mg total) by mouth daily.     Allergies:   Hydrochlorothiazide, Penicillins, Zanaflex [tizanidine], and Lisinopril   Social History   Socioeconomic History   Marital status: Married    Spouse name: Not on file   Number of children: Not on file   Years of education: Not on file   Highest education level: Not on file  Occupational History   Not on file  Tobacco Use   Smoking status: Former    Packs/day: 0.25    Years: 5.00    Additional pack years: 0.00    Total pack years: 1.25    Types: Cigarettes    Quit date:  09/08/2006    Years since quitting: 15.8   Smokeless tobacco: Never  Vaping Use   Vaping Use: Never used  Substance and Sexual Activity   Alcohol use: Not Currently    Alcohol/week: 3.0 standard drinks of alcohol    Types: 3 Shots of liquor per week    Comment: 1 beer occ   Drug use: No   Sexual activity: Not on file  Other Topics Concern   Not on file  Social History Narrative   Not on file   Social Determinants of Health   Financial Resource Strain: Not on file  Food Insecurity: No Food Insecurity (02/08/2022)   Hunger Vital Sign    Worried About Running Out of Food in the Last Year: Never true    Ran Out of Food in the Last Year: Never true  Transportation Needs: No Transportation Needs (02/08/2022)   PRAPARE - Hydrologist (Medical): No    Lack of Transportation (Non-Medical): No  Physical Activity: Not on file  Stress: Not on file  Social Connections: Not on file     Family History: The patient's family history includes Heart failure in her mother.  ROS:   Please see the history of present illness.     All other systems reviewed and are negative.  EKGs/Labs/Other Studies Reviewed:    The following studies were reviewed today:   EKG:  EKG not ordered today.   Recent Labs: 04/16/2022: BUN 9; Creatinine, Ser 0.71; Hemoglobin 10.1; Platelets 484; Potassium 3.7; Sodium 140  Recent Lipid Panel No results found for: "CHOL", "TRIG", "HDL", "CHOLHDL", "VLDL", "LDLCALC", "LDLDIRECT"   Risk Assessment/Calculations:             Physical Exam:    VS:  BP 138/82 (BP Location: Left Arm, Patient Position: Sitting, Cuff Size: Normal)   Pulse 95   Ht 5\' 7"  (1.702 m)   Wt (!) 309 lb 12.8 oz (140.5 kg)   SpO2 97%   BMI 48.52 kg/m     Wt Readings from Last 3 Encounters:  06/30/22 (!) 309 lb 12.8 oz (140.5 kg)  05/12/22 298 lb (135.2 kg)  02/08/22 (!) 311 lb 1.1 oz (141.1 kg)     GEN:  Well nourished, well developed in no acute  distress HEENT: Normal NECK: No JVD; No carotid bruits CARDIAC: RRR, no murmurs, rubs, gallops RESPIRATORY:  Clear to auscultation without rales, wheezing or rhonchi  ABDOMEN: Soft, non-tender, non-distended MUSCULOSKELETAL:  No edema; No deformity  SKIN: Warm and dry NEUROLOGIC:  Alert and oriented x 3 PSYCHIATRIC:  Normal affect   ASSESSMENT:    1. Primary hypertension   2. Morbid obesity ([place])     PLAN:    In order of problems listed above:  Hypertension,  BP much improved but still elevated.  Increase losartan to 50 mg daily, continue Norvasc 5 mg daily.  Low-salt diet emphasized.  Follow-up appointment with PCP. Morbid obesity, low-calorie diet, weight loss advised.   Follow-up in 6 months.  For BP check.  If BP controlled, okay to follow-up as needed after.     Medication Adjustments/Labs and Tests Ordered: Current medicines are reviewed at length with the patient today.  Concerns regarding medicines are outlined above.  No orders of the defined types were placed in this encounter.  Meds ordered this encounter  Medications   losartan (COZAAR) 50 MG tablet    Sig: Take 1 tablet (50 mg total) by mouth daily.    Dispense:  90 tablet    Refill:  0    Patient Instructions  Medication Instructions:   INCREASE Losartan - take one tablet ( 50 mg) by mouth daily.  *If you need a refill on your cardiac medications before your next appointment, please call your pharmacy*   Lab Work:  None Ordered  If you have labs (blood work) drawn today and your tests are completely normal, you will receive your results only by: [place] (if you have MyChart) OR A paper copy in the mail If you have any lab test that is abnormal or we need to change your treatment, we will call you to review the results.   Testing/Procedures:  None Ordered   Follow-Up: At [facility], you and your health needs are our priority.  As part of our continuing mission to provide  you with exceptional heart care, we have created designated Provider Care Teams.  These Care Teams include your primary Cardiologist (physician) and Advanced Practice Providers (APPs -  Physician Assistants and Nurse Practitioners) who all work together to provide you with the care you need, when you need it.  We recommend signing up for the patient portal called "MyChart".  Sign up information is provided on this After Visit Summary.  MyChart is used to connect with patients for Virtual Visits (Telemedicine).  Patients are able to view lab/test results, encounter notes, upcoming appointments, etc.  Non-urgent messages can be sent to your provider as well.   To learn more about what you can do with MyChart, go to [email or web address].    Your next appointment:   6 month(s)  Provider:   You may see Kate Sable, MD or one of the following Advanced Practice Providers on your designated Care Team:   Murray Hodgkins, NP Christell Faith, PA-C Cadence Kathlen Mody, PA-C Gerrie Nordmann, NP    Signed, Kate Sable, MD  06/30/2022 11:07 AM    [place]

## 2022-06-30 NOTE — Patient Instructions (Signed)
Medication Instructions:   INCREASE Losartan - take one tablet ( 50 mg) by mouth daily.  *If you need a refill on your cardiac medications before your next appointment, please call your pharmacy*   Lab Work:  None Ordered  If you have labs (blood work) drawn today and your tests are completely normal, you will receive your results only by: [place] (if you have MyChart) OR A paper copy in the mail If you have any lab test that is abnormal or we need to change your treatment, we will call you to review the results.   Testing/Procedures:  None Ordered   Follow-Up: At [facility], you and your health needs are our priority.  As part of our continuing mission to provide you with exceptional heart care, we have created designated Provider Care Teams.  These Care Teams include your primary Cardiologist (physician) and Advanced Practice Providers (APPs -  Physician Assistants and Nurse Practitioners) who all work together to provide you with the care you need, when you need it.  We recommend signing up for the patient portal called "MyChart".  Sign up information is provided on this After Visit Summary.  MyChart is used to connect with patients for Virtual Visits (Telemedicine).  Patients are able to view lab/test results, encounter notes, upcoming appointments, etc.  Non-urgent messages can be sent to your provider as well.   To learn more about what you can do with MyChart, go to [email or web address].    Your next appointment:   6 month(s)  Provider:   You may see Kate Sable, MD or one of the following Advanced Practice Providers on your designated Care Team:   Murray Hodgkins, NP Christell Faith, PA-C Cadence Kathlen Mody, PA-C Gerrie Nordmann, NP

## 2022-09-06 ENCOUNTER — Encounter: Payer: Self-pay | Admitting: Dietician

## 2022-09-06 ENCOUNTER — Other Ambulatory Visit: Payer: Self-pay | Admitting: Internal Medicine

## 2022-09-06 ENCOUNTER — Encounter: Payer: Medicare Other | Attending: Orthopedic Surgery | Admitting: Dietician

## 2022-09-06 DIAGNOSIS — Z1231 Encounter for screening mammogram for malignant neoplasm of breast: Secondary | ICD-10-CM

## 2022-09-06 DIAGNOSIS — E119 Type 2 diabetes mellitus without complications: Secondary | ICD-10-CM | POA: Insufficient documentation

## 2022-09-06 DIAGNOSIS — Z713 Dietary counseling and surveillance: Secondary | ICD-10-CM | POA: Diagnosis present

## 2022-09-06 NOTE — Patient Instructions (Addendum)
When having Oreo cookies, start having 2 at a time instead of 4!  Choose bagel thins in place of a regular bagel. Replace your pork sausage with [place] sausage.  Keep up the great work with your arm chair exercises  Work towards eating three meals a day, about 5-6 hours apart!  Begin to recognize carbohydrates, proteins, and non-starchy vegetables in your food choices!  Begin to build your meals using the proportions of the Balanced Plate. First, select your carb choice(s) for the meal. Make this 25% of your meal. Next, select your source of protein to pair with your carb choice(s). Make this another 25% of your meal. Finally, complete your meal with a variety of non-starchy vegetables. Make this the remaining 50% of your meal.  Check your blood sugar each morning before eating or drinking (fasting).  Look for numbers under 125 mg/dL  Your goal N8G is below 7.0%

## 2022-09-06 NOTE — Progress Notes (Signed)
Diabetes Self-Management Education  Visit Type: First/Initial  Appt. Start Time: 29562 Appt. End Time: 1040  09/06/2022  [name], identified by name and date of birth, is a 67 y.o. female with a diagnosis of Diabetes: Type 2.   ASSESSMENT Pt reports concern that they are gaining weight, unsure why. Pt states they are at today's appointment at recommendation of MD. Pt reports checking FBG daily, normal range of 125 - 150 mg/dL, keeps a log of numbers. Pt states that their FBG number are higher when they snack at night.  Pt reports being a snacker (popcorn, chips, Oreos), states they usually skip lunch.  Pt states that they watch TV and snack since retiring in 2017,  Pt reports eating pickles in the evening, states that they love rice Pt reports cutting back on fried foods, eating more baked foods. Pt reports recently getting an air fryer, would like to cook more with it. Pt reports history of exercising, would walk treadmill or stairmaster. Pt states that they will walk around the house a few times during the day.Pt reports starting to do arm chair exercises recently as well.  There were no vitals taken for this visit. There is no height or weight on file to calculate BMI.   Diabetes Self-Management Education - 09/06/22 0953       Visit Information   Visit Type First/Initial      Initial Visit   Diabetes Type Type 2    Are you currently following a meal plan? No    Are you taking your medications as prescribed? Yes      Health Coping   How would you rate your overall health? Good      Psychosocial Assessment   Patient Belief/Attitude about Diabetes Other (comment)   Ok   What is the hardest part about your diabetes right now, causing you the most concern, or is the most worrisome to you about your diabetes?   Making healty food and beverage choices   Snacking   Self-management support Doctor's office    Other persons present Patient    Patient Concerns Nutrition/Meal  planning;Weight Control    Special Needs None    Preferred Learning Style No preference indicated    Learning Readiness Contemplating    How often do you need to have someone help you when you read instructions, pamphlets, or other written materials from your doctor or pharmacy? 1 - Never    What is the last grade level you completed in school? 12th grade      Pre-Education Assessment   Patient understands the diabetes disease and treatment process. Needs Instruction    Patient understands incorporating nutritional management into lifestyle. Needs Instruction    Patient undertands incorporating physical activity into lifestyle. Needs Instruction    Patient understands using medications safely. Needs Instruction    Patient understands monitoring blood glucose, interpreting and using results Needs Instruction    Patient understands prevention, detection, and treatment of acute complications. Needs Instruction    Patient understands prevention, detection, and treatment of chronic complications. Needs Instruction    Patient understands how to develop strategies to address psychosocial issues. Needs Instruction    Patient understands how to develop strategies to promote health/change behavior. Needs Instruction      Complications   Last HgB A1C per patient/outside source 7.7 %   05/11/2022   How often do you check your blood sugar? 1-2 times/day    Fasting Blood glucose range (mg/dL) 130-865  Number of hypoglycemic episodes per month 0    Number of hyperglycemic episodes ( >200mg /dL): Never    Have you had a dilated eye exam in the past 12 months? Yes    Have you had a dental exam in the past 12 months? No    Are you checking your feet? Yes    How many days per week are you checking your feet? 2      Dietary Intake   Breakfast [employer], croaissant cheese and sausage, coffee w/ cream    Snack (morning) none    Lunch none    Snack (afternoon) none    Dinner [employer], Pinto beans,  seasoned rice    Snack (evening) 2 pickles, Outshine ice cream    Beverage(s) Water, coffee      Activity / Exercise   Activity / Exercise Type ADL's    How many days per week do you exercise? 0    How many minutes per day do you exercise? 0    Total minutes per week of exercise 0      Patient Education   Previous Diabetes Education No    Disease Pathophysiology Factors that contribute to the development of diabetes;Explored patient's options for treatment of their diabetes    Healthy Eating Role of diet in the treatment of diabetes and the relationship between the three main macronutrients and blood glucose level;Plate Method;Reviewed blood glucose goals for pre and post meals and how to evaluate the patients' food intake on their blood glucose level.;Meal options for control of blood glucose level and chronic complications.;Information on hints to eating out and maintain blood glucose control.    Being Active Role of exercise on diabetes management, blood pressure control and cardiac health.;Helped patient identify appropriate exercises in relation to his/her diabetes, diabetes complications and other health issue.    Medications Reviewed patients medication for diabetes, action, purpose, timing of dose and side effects.    Monitoring Identified appropriate SMBG and/or A1C goals.    Acute complications Taught prevention, symptoms, and  treatment of hypoglycemia - the 15 rule.    Chronic complications Relationship between chronic complications and blood glucose control;Lipid levels, blood glucose control and heart disease;Nephropathy, what it is, prevention of, the use of ACE, ARB's and early detection of through urine microalbumia.    Diabetes Stress and Support Role of stress on diabetes    Lifestyle and Health Coping Lifestyle issues that need to be addressed for better diabetes care      Individualized Goals (developed by patient)   Nutrition Follow meal plan discussed;General guidelines  for healthy choices and portions discussed    Physical Activity Exercise 3-5 times per week    Medications take my medication as prescribed    Monitoring  Test my blood glucose as discussed    Problem Solving Eating Pattern      Post-Education Assessment   Patient understands the diabetes disease and treatment process. Needs Review    Patient understands incorporating nutritional management into lifestyle. Needs Review    Patient undertands incorporating physical activity into lifestyle. Needs Review    Patient understands using medications safely. Needs Review    Patient understands monitoring blood glucose, interpreting and using results Needs Review    Patient understands prevention, detection, and treatment of acute complications. Needs Review    Patient understands prevention, detection, and treatment of chronic complications. Needs Review    Patient understands how to develop strategies to address psychosocial issues. Needs Review  Patient understands how to develop strategies to promote health/change behavior. Needs Review      Outcomes   Expected Outcomes Demonstrated interest in learning but significant barriers to change    Future DMSE 4-6 wks    Program Status Not Completed             Individualized Plan for Diabetes Self-Management Training:   Learning Objective:  Patient will have a greater understanding of diabetes self-management. Patient education plan is to attend individual and/or group sessions per assessed needs and concerns.   Plan:   Patient Instructions  When having Oreo cookies, start having 2 at a time instead of 4!  Choose bagel thins in place of a regular bagel. Replace your pork sausage with [place] sausage.  Keep up the great work with your arm chair exercises  Work towards eating three meals a day, about 5-6 hours apart!  Begin to recognize carbohydrates, proteins, and non-starchy vegetables in your food choices!  Begin to build your meals  using the proportions of the Balanced Plate. First, select your carb choice(s) for the meal. Make this 25% of your meal. Next, select your source of protein to pair with your carb choice(s). Make this another 25% of your meal. Finally, complete your meal with a variety of non-starchy vegetables. Make this the remaining 50% of your meal.  Check your blood sugar each morning before eating or drinking (fasting).  Look for numbers under 125 mg/dL  Your goal Z6X is below 7.0%    Expected Outcomes:  Demonstrated interest in learning but significant barriers to change  Education material provided: My Plate  If problems or questions, patient to contact team via:  Phone and Email  Future DSME appointment: 4-6 wks

## 2022-10-19 ENCOUNTER — Ambulatory Visit: Payer: Medicare Other | Admitting: Dietician

## 2022-10-25 ENCOUNTER — Ambulatory Visit: Payer: Medicare Other | Admitting: Dietician

## 2022-10-27 ENCOUNTER — Ambulatory Visit
Admission: RE | Admit: 2022-10-27 | Discharge: 2022-10-27 | Disposition: A | Discharge status: Home / Self Care | Payer: Medicare Other | Source: Ambulatory Visit | Attending: Internal Medicine | Admitting: Internal Medicine

## 2022-10-27 DIAGNOSIS — Z1231 Encounter for screening mammogram for malignant neoplasm of breast: Secondary | ICD-10-CM | POA: Insufficient documentation

## 2022-11-02 ENCOUNTER — Encounter: Payer: Self-pay | Admitting: Dietician

## 2022-11-02 ENCOUNTER — Encounter: Payer: Medicare Other | Attending: Orthopedic Surgery | Admitting: Dietician

## 2022-11-02 DIAGNOSIS — E119 Type 2 diabetes mellitus without complications: Secondary | ICD-10-CM | POA: Diagnosis present

## 2022-11-02 NOTE — Patient Instructions (Addendum)
Pick a medium sized bowl to put your popcorn in when snacking at night. Be sure to leave the bag in the cupboard instead of eating out of it.  When having fruit, work to have no more than 2-3 per serving. 1 serving of fruit is 1 cup, or about the size of your fist.  Try to always pair a protein with your fruits!  Choose [place] Light n' Fit greek yogurt, a reduced fat cheese stick, 1-2 Tbsp of peanut butter, or a palm full of nuts!

## 2022-11-02 NOTE — Progress Notes (Signed)
Diabetes Self-Management Education  Visit Type: Follow-up  Appt. Start Time: 0820 Appt. End Time: 0855  11/02/2022  Ellen Garcia, identified by name and date of birth, is a 67 y.o. female with a diagnosis of Diabetes:  .   ASSESSMENT  There were no vitals taken for this visit. There is no height or weight on file to calculate BMI.  Pt reports elevated A1c since last visit, up from 7.7% to 7.9%, Pt states it was due to what they were eating at the time. Pt reports now cutting out sweets at night time, eating more fruits, eating three meals per day, and monitoring portions. Pt reports meal prepping on Sunday that will last them for about 3 days worth of breakfast and dinner meals. Pt reports checking FBG daily, 126 this morning, Pt states FBG will be higher when snacking at night. Pt reports a fall in the grocery store last week, states they feel weak in their legs and is having difficulty with a degenerative disc in their back. Pt will be starting PT next month. Pt reports doing arm chair exercises while watching Olympics, pt will lift cans of ravioli, and do some leg lifts/light walking in the house.   Diabetes Self-Management Education - 11/02/22 0829       Visit Information   Visit Type Follow-up      Complications   Last HgB A1C per patient/outside source 7.9 %   09/09/2022   How often do you check your blood sugar? 1-2 times/day    Fasting Blood glucose range (mg/dL) 16-109;604-540      Dietary Intake   Breakfast sausage patty and rice, orange juice, water    Lunch 1/2 salami and cheese sandwich, wheat bread, dill pickle chips    Dinner Pork chops, cabbage, squash, cornbread, orange soda    Snack (evening) Buttered popcorn, dill pickle    Beverage(s) Water, OJ, orange soda      Activity / Exercise   Activity / Exercise Type ADL's;Light (walking / raking leaves)    How many days per week do you exercise? 5    How many minutes per day do you exercise? 15    Total  minutes per week of exercise 75      Subsequent Visit   Since your last visit have you continued or begun to take your medications as prescribed? Yes    Since your last visit have you had your blood pressure checked? No    Since your last visit have you experienced any weight changes? No change    Since your last visit, are you checking your blood glucose at least once a day? Yes             Individualized Plan for Diabetes Self-Management Training:   Learning Objective:  Patient will have a greater understanding of diabetes self-management. Patient education plan is to attend individual and/or group sessions per assessed needs and concerns.   Plan:   Patient Instructions  Pick a medium sized bowl to put your popcorn in when snacking at night. Be sure to leave the bag in the cupboard instead of eating out of it.  When having fruit, work to have no more than 2-3 per serving. 1 serving of fruit is 1 cup, or about the size of your fist.  Try to always pair a protein with your fruits!  Choose [place] Light n' Fit greek yogurt, a reduced fat cheese stick, 1-2 Tbsp of peanut butter, or a palm full of nuts!  Expected Outcomes:     Education material provided: Protein list  If problems or questions, patient to contact team via:  Phone and Email  Future DSME appointment:

## 2022-11-29 ENCOUNTER — Other Ambulatory Visit: Payer: Self-pay | Admitting: Physical Medicine and Rehabilitation

## 2022-11-29 DIAGNOSIS — M5416 Radiculopathy, lumbar region: Secondary | ICD-10-CM

## 2022-11-29 DIAGNOSIS — M48062 Spinal stenosis, lumbar region with neurogenic claudication: Secondary | ICD-10-CM

## 2022-12-07 ENCOUNTER — Ambulatory Visit
Admission: RE | Admit: 2022-12-07 | Discharge: 2022-12-07 | Disposition: A | Discharge status: Home / Self Care | Payer: Medicare Other | Source: Ambulatory Visit | Attending: Physical Medicine and Rehabilitation | Admitting: Physical Medicine and Rehabilitation

## 2022-12-07 DIAGNOSIS — M5416 Radiculopathy, lumbar region: Secondary | ICD-10-CM

## 2022-12-07 DIAGNOSIS — M48062 Spinal stenosis, lumbar region with neurogenic claudication: Secondary | ICD-10-CM

## 2022-12-22 ENCOUNTER — Other Ambulatory Visit: Payer: Medicare Other

## 2022-12-30 ENCOUNTER — Encounter: Payer: Medicare Other | Attending: Orthopedic Surgery | Admitting: Dietician

## 2022-12-30 ENCOUNTER — Encounter: Payer: Self-pay | Admitting: Dietician

## 2022-12-30 DIAGNOSIS — E119 Type 2 diabetes mellitus without complications: Secondary | ICD-10-CM | POA: Diagnosis not present

## 2022-12-30 DIAGNOSIS — Z713 Dietary counseling and surveillance: Secondary | ICD-10-CM | POA: Insufficient documentation

## 2022-12-30 DIAGNOSIS — Z7984 Long term (current) use of oral hypoglycemic drugs: Secondary | ICD-10-CM | POA: Insufficient documentation

## 2022-12-30 NOTE — Progress Notes (Signed)
Diabetes Self-Management Education  Visit Type: Follow-up  Appt. Start Time: 0950 Appt. End Time: 1030  12/30/2022  [name], identified by name and date of birth, is a 67 y.o. female with a diagnosis of Diabetes:  .   ASSESSMENT  There were no vitals taken for this visit. There is no height or weight on file to calculate BMI.  Pt reports continuing to take Glipizide and Pioglitazone for DM, no hypoglycemia reported. Pt reports labs will be taken on 01/06/2023, will be seeing cardiologist tomorrow. Pt reports getting MRI that revealed bulging/slipped disc in their lower back, physical therapy didn't help, will be receiving an epidural and possible nerve blocking. Pain arises most when standing for extended period of time, prescribed Tramadol that gives relief. Pt reports they have not been checking blood sugar recently, last FBG was taken 11/14/2022, 154 mg/dL. FBG values ranged from 160 - 200 in the first week of August. Pt checked FBG during visit, value of 167 mg/dL. Pt reports eating greek yogurt for breakfast, and with lunch, states they feel more energetic after eating yogurt/fruit. Pt reports snacking on grapes in the evening instead of chips, having smaller portions at mealtimes.  Pt reports continuing to do chair exercises, walking around the house, not feeling as tired anymore. Pt reports thinking they have lost some weight, states clothes are feeling looser now.   Diabetes Self-Management Education - 12/30/22 1200       Visit Information   Visit Type Follow-up      Psychosocial Assessment   What is the hardest part about your diabetes right now, causing you the most concern, or is the most worrisome to you about your diabetes?   Checking blood sugar;Being active      Pre-Education Assessment   Patient understands the diabetes disease and treatment process. Needs Review    Patient understands incorporating nutritional management into lifestyle. Needs Review     Patient undertands incorporating physical activity into lifestyle. Needs Review    Patient understands using medications safely. Needs Review    Patient understands monitoring blood glucose, interpreting and using results Needs Review    Patient understands prevention, detection, and treatment of acute complications. Needs Review    Patient understands prevention, detection, and treatment of chronic complications. Needs Review    Patient understands how to develop strategies to address psychosocial issues. Needs Review    Patient understands how to develop strategies to promote health/change behavior. Needs Review      Complications   How often do you check your blood sugar? 0 times/day (not testing)    Fasting Blood glucose range (mg/dL) 161-096   Checked FBG during visit     Dietary Intake   Breakfast sausage and egg sandwich on wheat toast, water    Lunch [place] yogurt, green grapes    Dinner [employer] on wheat toast, Funnyuns    Beverage(s) water, occasional soda, juice, beer      Activity / Exercise   Activity / Exercise Type ADL's;Light (walking / raking leaves)    How many days per week do you exercise? 5    How many minutes per day do you exercise? 10    Total minutes per week of exercise 50      Individualized Goals (developed by patient)   Nutrition General guidelines for healthy choices and portions discussed    Physical Activity Exercise 3-5 times per week    Medications take my medication as prescribed    Monitoring  Test  my blood glucose as discussed    Problem Solving Addressing barriers to behavior change    Reducing Risk examine blood glucose patterns      Patient Self-Evaluation of Goals - Patient rates self as meeting previously set goals (% of time)   Nutrition 25 - 50% (sometimes)    Physical Activity 25 - 50% (sometimes)    Medications >75% (most of the time)    Monitoring 25 - 50% (sometimes)    Problem Solving and behavior change strategies  25 - 50%  (sometimes)    Reducing Risk (treating acute and chronic complications) < 25% (hardly ever/never)    Health Coping 25 - 50% (sometimes)      Post-Education Assessment   Patient understands the diabetes disease and treatment process. Needs Review    Patient understands incorporating nutritional management into lifestyle. Comprehends key points    Patient undertands incorporating physical activity into lifestyle. Comprehends key points    Patient understands using medications safely. Demonstrates understanding / competency    Patient understands monitoring blood glucose, interpreting and using results Needs Review    Patient understands prevention, detection, and treatment of acute complications. Comprehends key points    Patient understands prevention, detection, and treatment of chronic complications. Comprehends key points    Patient understands how to develop strategies to address psychosocial issues. Needs Review    Patient understands how to develop strategies to promote health/change behavior. Needs Review      Outcomes   Expected Outcomes Demonstrated interest in learning but significant barriers to change    Future DMSE 3-4 months    Program Status Not Completed      Subsequent Visit   Since your last visit have you continued or begun to take your medications as prescribed? Yes    Since your last visit have you had your blood pressure checked? No    Since your last visit have you experienced any weight changes? Loss    Weight Loss (lbs) --   Self-reported loss   Since your last visit, are you checking your blood glucose at least once a day? No             Individualized Plan for Diabetes Self-Management Training:   Learning Objective:  Patient will have a greater understanding of diabetes self-management. Patient education plan is to attend individual and/or group sessions per assessed needs and concerns.   Plan:   Patient Instructions  Put your meter on your bedside  table every night to remind yourself to check.  Prick on the side of the finger, near the tip beside your finger nail. If blood is slow to come out, massage finger in a downward motion to increase amount of blood.  If sample is too small doesn't come out at all, increase the depth number on your lancing device.  Check your blood sugar each morning before eating or drinking (fasting). Look for numbers between 70-130 mg/dL  Your goal X3K is below 7.0%  Try wheat bagel thins in place of regular bagels!  Look for weight loss of 1-2 lbs per week.  Expected Outcomes:  Demonstrated interest in learning but significant barriers to change  If problems or questions, patient to contact team via:  Phone and Email  Future DSME appointment: 3-4 months

## 2022-12-30 NOTE — Patient Instructions (Addendum)
Put your meter on your bedside table every night to remind yourself to check.  Prick on the side of the finger, near the tip beside your finger nail. If blood is slow to come out, massage finger in a downward motion to increase amount of blood.  If sample is too small doesn't come out at all, increase the depth number on your lancing device.  Check your blood sugar each morning before eating or drinking (fasting). Look for numbers between 70-130 mg/dL  Your goal W0J is below 7.0%  Try wheat bagel thins in place of regular bagels!  Look for weight loss of 1-2 lbs per week.

## 2022-12-31 ENCOUNTER — Encounter: Payer: Self-pay | Admitting: Cardiology

## 2022-12-31 ENCOUNTER — Ambulatory Visit: Payer: Medicare Other | Attending: Cardiology | Admitting: Cardiology

## 2023-01-13 ENCOUNTER — Ambulatory Visit: Payer: Medicare Other | Attending: Cardiology | Admitting: Cardiology

## 2023-01-13 ENCOUNTER — Encounter: Payer: Self-pay | Admitting: Cardiology

## 2023-01-13 VITALS — BP 138/88 | HR 78 | Ht 67.0 in | Wt 294.0 lb

## 2023-01-13 DIAGNOSIS — K219 Gastro-esophageal reflux disease without esophagitis: Secondary | ICD-10-CM | POA: Insufficient documentation

## 2023-01-13 DIAGNOSIS — I10 Essential (primary) hypertension: Secondary | ICD-10-CM | POA: Diagnosis not present

## 2023-01-13 NOTE — Patient Instructions (Signed)
Medication Instructions:   Your physician recommends that you continue on your current medications as directed. Please refer to the Current Medication list given to you today.  *If you need a refill on your cardiac medications before your next appointment, please call your pharmacy*   Lab Work:  None Ordered  If you have labs (blood work) drawn today and your tests are completely normal, you will receive your results only by: MyChart Message (if you have MyChart) OR A paper copy in the mail If you have any lab test that is abnormal or we need to change your treatment, we will call you to review the results.   Testing/Procedures:  None Ordered   Follow-Up: At [facility], you and your health needs are our priority.  As part of our continuing mission to provide you with exceptional heart care, we have created designated Provider Care Teams.  These Care Teams include your primary Cardiologist (physician) and Advanced Practice Providers (APPs -  Physician Assistants and Nurse Practitioners) who all work together to provide you with the care you need, when you need it.  We recommend signing up for the patient portal called "MyChart".  Sign up information is provided on this After Visit Summary.  MyChart is used to connect with patients for Virtual Visits (Telemedicine).  Patients are able to view lab/test results, encounter notes, upcoming appointments, etc.  Non-urgent messages can be sent to your provider as well.   To learn more about what you can do with MyChart, go to [email or web address].    Your next appointment:  AS NEEDED 

## 2023-01-13 NOTE — Progress Notes (Signed)
Cardiology Office Note:    Date:  01/13/2023   ID:  Ellen Garcia, DOB 1955-04-12, MRN [id number]  PCP:  Gracelyn Nurse, MD   [facility] Providers Cardiologist:  Debbe Odea, MD     Referring MD: Gracelyn Nurse, MD   Chief Complaint  Patient presents with   Follow-up    Patient denies new or acute cardiac problems/concerns today.      History of Present Illness:    Ellen Garcia is a 67 y.o. female with a hx of hypertension, diabetes, rheumatoid arthritis who presents for follow-up.    Losartan previously increased to 50 mg daily for better BP control.  Also takes Norvasc 5 mg daily as prescribed.  Denies any adverse effects with medications.  Feels well, no concerns at this time.  Blood pressure are adequately controlled at home with systolics in the 130s.  Prior notes Echocardiogram 04/16/2022 EF 60 to 65%, small posterolateral pericardial effusion, no evidence for tamponade.   Past Medical History:  Diagnosis Date   Ankle fracture    Diverticulosis    DM (diabetes mellitus), type 2 (HCC)    Hypertension    denies taking medication   Neuropathy    legs   Neuropathy    Rheumatoid arthritis (HCC)     Past Surgical History:  Procedure Laterality Date   ABDOMINAL HYSTERECTOMY     BREAST CYST EXCISION Left    COLONOSCOPY WITH PROPOFOL N/A 12/26/2018   Procedure: COLONOSCOPY WITH PROPOFOL;  Surgeon: Toledo, Boykin Nearing, MD;  Location: [facility] ENDOSCOPY;  Service: Gastroenterology;  Laterality: N/A;   CYST EXCISION Right    upper arm   NECK MASS EXCISION     ORIF ANKLE FRACTURE Left 09/09/2017   Procedure: OPEN REDUCTION INTERNAL FIXATION (ORIF) ANKLE FRACTURE-TRIMALLEOLAR;  Surgeon: Gwyneth Revels, DPM;  Location: [facility] ORS;  Service: Podiatry;  Laterality: Left;   TOTAL KNEE ARTHROPLASTY Left 02/01/2017   Procedure: TOTAL KNEE ARTHROPLASTY;  Surgeon: Kennedy Bucker, MD;  Location: [facility] ORS;  Service: Orthopedics;  Laterality: Left;   TOTAL KNEE  REVISION Left 02/08/2022   Procedure: Left total knee revision, both components;  Surgeon: Reinaldo Berber, MD;  Location: [facility] ORS;  Service: Orthopedics;  Laterality: Left;    Current Medications: Current Meds  Medication Sig   acetaminophen (TYLENOL) 500 MG tablet Take 2 tablets (1,000 mg total) by mouth every 6 (six) hours as needed.   Adalimumab (HUMIRA PEN) 40 MG/0.4ML PNKT Inject 40 mg into the skin See admin instructions. Every 10 days   albuterol (VENTOLIN HFA) 108 (90 Base) MCG/ACT inhaler Inhale 2 puffs into the lungs every 6 (six) hours as needed for wheezing or shortness of breath.   amLODipine (NORVASC) 5 MG tablet Take 5 mg by mouth every morning.   BIOTIN PO Take 1 tablet by mouth daily at 6 (six) AM.   cyclobenzaprine (FLEXERIL) 5 MG tablet Take 5 mg by mouth at bedtime.   diphenhydrAMINE (BENADRYL) 25 mg capsule Take 50 mg by mouth at bedtime as needed for allergies or sleep.   gabapentin (NEURONTIN) 300 MG capsule Take 300 mg by mouth 3 (three) times daily.   glipiZIDE (GLUCOTROL) 5 MG tablet Take 5 mg by mouth daily before breakfast.   Green Tea, Camellia sinensis, (GREEN TEA PO) Take 500 mg by mouth 2 (two) times daily.   leflunomide (ARAVA) 10 MG tablet Take 10 mg by mouth daily.   losartan (COZAAR) 50 MG tablet Take 1 tablet (50 mg total) by  mouth daily.   Multiple Vitamin (MULTIVITAMIN) tablet Take 1 tablet by mouth daily.   pioglitazone (ACTOS) 30 MG tablet Take 30 mg by mouth every morning.   traMADol (ULTRAM) 50 MG tablet Take 25-50 mg by mouth 2 (two) times daily as needed.     Allergies:   Hydrochlorothiazide, Penicillins, Zanaflex [tizanidine], and Lisinopril   Social History   Socioeconomic History   Marital status: Married    Spouse name: Not on file   Number of children: Not on file   Years of education: Not on file   Highest education level: Not on file  Occupational History   Not on file  Tobacco Use   Smoking status: Former    Current  packs/day: 0.00    Average packs/day: 0.3 packs/day for 5.0 years (1.3 ttl pk-yrs)    Types: Cigarettes    Start date: 09/07/2001    Quit date: 09/08/2006    Years since quitting: 16.3   Smokeless tobacco: Never  Vaping Use   Vaping status: Never Used  Substance and Sexual Activity   Alcohol use: Not Currently    Alcohol/week: 3.0 standard drinks of alcohol    Types: 3 Shots of liquor per week    Comment: 1 beer occ   Drug use: No   Sexual activity: Not on file  Other Topics Concern   Not on file  Social History Narrative   Not on file   Social Determinants of Health   Financial Resource Strain: Not on file  Food Insecurity: No Food Insecurity (02/08/2022)   Hunger Vital Sign    Worried About Running Out of Food in the Last Year: Never true    Ran Out of Food in the Last Year: Never true  Transportation Needs: No Transportation Needs (02/08/2022)   PRAPARE - Administrator, Civil Service (Medical): No    Lack of Transportation (Non-Medical): No  Physical Activity: Not on file  Stress: Not on file  Social Connections: Not on file     Family History: The patient's family history includes Heart failure in her mother.  ROS:   Please see the history of present illness.     All other systems reviewed and are negative.  EKGs/Labs/Other Studies Reviewed:    The following studies were reviewed today:   EKG Interpretation Date/Time:  Thursday January 13 2023 10:40:32 EDT Ventricular Rate:  78 PR Interval:  146 QRS Duration:  76 QT Interval:  394 QTC Calculation: 449 R Axis:   -15  Text Interpretation: Normal sinus rhythm Minimal voltage criteria for LVH, may be normal variant ( R in aVL ) Confirmed by Debbe Odea ([id number]) on 01/13/2023 10:59:34 AM    Recent Labs: 04/16/2022: BUN 9; Creatinine, Ser 0.71; Hemoglobin 10.1; Platelets 484; Potassium 3.7; Sodium 140  Recent Lipid Panel No results found for: "CHOL", "TRIG", "HDL", "CHOLHDL", "VLDL", "LDLCALC",  "LDLDIRECT"   Risk Assessment/Calculations:             Physical Exam:    VS:  BP 138/88 (BP Location: Left Arm, Patient Position: Sitting, Cuff Size: Large)   Pulse 78   Ht 5\' 7"  (1.702 m)   Wt 294 lb (133.4 kg)   SpO2 98%   BMI 46.05 kg/m     Wt Readings from Last 3 Encounters:  01/13/23 294 lb (133.4 kg)  06/30/22 (!) 309 lb 12.8 oz (140.5 kg)  05/12/22 298 lb (135.2 kg)     GEN:  Well nourished, well developed in no  acute distress HEENT: Normal NECK: No JVD; No carotid bruits CARDIAC: RRR, no murmurs, rubs, gallops RESPIRATORY:  Clear to auscultation without rales, wheezing or rhonchi  ABDOMEN: Soft, non-tender, non-distended MUSCULOSKELETAL:  No edema; No deformity  SKIN: Warm and dry NEUROLOGIC:  Alert and oriented x 3 PSYCHIATRIC:  Normal affect   ASSESSMENT:    1. Primary hypertension   2. Morbid obesity (HCC)    PLAN:    In order of problems listed above:  Hypertension, BP reasonably controlled.  Continue losartan 50 mg daily, continue Norvasc 5 mg daily.  Low-salt diet emphasized.  Follow-up appointment with PCP. Morbid obesity, low-calorie diet, weight loss advised.  Follow-up as needed.     Medication Adjustments/Labs and Tests Ordered: Current medicines are reviewed at length with the patient today.  Concerns regarding medicines are outlined above.  Orders Placed This Encounter  Procedures   EKG 12-Lead   No orders of the defined types were placed in this encounter.   There are no Patient Instructions on file for this visit.   Signed, Debbe Odea, MD  01/13/2023 12:08 PM    [facility]

## 2023-04-15 ENCOUNTER — Ambulatory Visit: Payer: Medicare Other | Admitting: Dietician

## 2023-05-06 ENCOUNTER — Encounter: Payer: No Typology Code available for payment source | Attending: Orthopedic Surgery | Admitting: Dietician

## 2023-05-06 ENCOUNTER — Encounter: Payer: Self-pay | Admitting: Dietician

## 2023-05-06 DIAGNOSIS — Z713 Dietary counseling and surveillance: Secondary | ICD-10-CM | POA: Diagnosis not present

## 2023-05-06 DIAGNOSIS — E639 Nutritional deficiency, unspecified: Secondary | ICD-10-CM | POA: Diagnosis not present

## 2023-05-06 DIAGNOSIS — E119 Type 2 diabetes mellitus without complications: Secondary | ICD-10-CM

## 2023-05-06 NOTE — Patient Instructions (Addendum)
Check your fasting blood sugar first thing in the morning before eating or drinking anything. Look for numbers under 130 mg/dL, and continue logging your numbers.  Your goal A1c is below 7.0%  If snacking on crackers, only have 1/2 of a short stack of Saltine crackers and pair it with a protein (yogurt, low fat cheese or meats, peanut butter).  Consider having a low-sugar protein shake as a snack or for breakfast (Premier, Glucerna, Splenda)  Keep up the great work drinking your water!!

## 2023-05-06 NOTE — Progress Notes (Signed)
Diabetes Self-Management Education  Visit Type: Follow-up  Appt. Start Time: 1120 Appt. End Time: 1155  05/06/2023  Ellen Garcia, identified by name and date of birth, is a 68 y.o. female with a diagnosis of Diabetes:  .   ASSESSMENT  There were no vitals taken for this visit. There is no height or weight on file to calculate BMI.  Pt reports continuing Glipizide and Pioglitazone for DM, no side effects. Pt reports one episode of severe hyperglycemia ~3 weeks ago after eating a large amount of sweets/baked goods, reports symptoms of dizziness/blurry vision, significant thirst, CBG >200 when checked. Pt states they drank numerous bottles of water and urinated multiple times and began to feel better. Pt reports this episode was scary for them and a wake up call to stay away from sweets. Pt reports snacking on yogurt and popcorn, switched from white rice to brown, continuing to work on smaller portions, eating more frozen vegetables/less canned veggies now, drinking a lot of water, drinking less soda (gingerale). Pt reports checking BG daily since January, provided log for RD review, FBG typically ranges 100 - 120 mg/dL, but has been as high as 200. Pt reports walking around their house for activity during cold days, doing ~50 laps each day.   Diabetes Self-Management Education - 05/06/23 1223       Visit Information   Visit Type Follow-up      Pre-Education Assessment   Patient understands the diabetes disease and treatment process. Needs Review    Patient understands incorporating nutritional management into lifestyle. Needs Review    Patient undertands incorporating physical activity into lifestyle. Needs Review    Patient understands using medications safely. Demonstrates understanding / competency    Patient understands monitoring blood glucose, interpreting and using results Comprehends key points    Patient understands prevention, detection, and treatment of acute  complications. Comprehends key points    Patient understands prevention, detection, and treatment of chronic complications. Compreheands key points    Patient understands how to develop strategies to address psychosocial issues. Needs Review    Patient understands how to develop strategies to promote health/change behavior. Needs Review      Complications   Last HgB A1C per patient/outside source 7.7 %    How often do you check your blood sugar? 1-2 times/day    Fasting Blood glucose range (mg/dL) 16-109;604-540;981-191      Dietary Intake   Lunch 1/2 hamburger, blooming onions, fries, water    Dinner Oriental noodles, crackers, water    Beverage(s) Water      Activity / Exercise   Activity / Exercise Type ADL's;Light (walking / raking leaves)   Walks around the house   How many days per week do you exercise? 20    How many minutes per day do you exercise? 5    Total minutes per week of exercise 100      Patient Education   Healthy Eating Information on hints to eating out and maintain blood glucose control.;Role of diet in the treatment of diabetes and the relationship between the three main macronutrients and blood glucose level    Being Active Helped patient identify appropriate exercises in relation to his/her diabetes, diabetes complications and other health issue.    Medications Reviewed patients medication for diabetes, action, purpose, timing of dose and side effects.    Monitoring Purpose and frequency of SMBG.;Identified appropriate SMBG and/or A1C goals.    Acute complications Discussed and identified patients' prevention, symptoms, and treatment  of hyperglycemia.    Chronic complications Relationship between chronic complications and blood glucose control    Lifestyle and Health Coping Lifestyle issues that need to be addressed for better diabetes care      Individualized Goals (developed by patient)   Nutrition Follow meal plan discussed    Physical Activity 15 minutes  per day    Medications take my medication as prescribed    Monitoring  Test my blood glucose as discussed;Send in my blood glucose log as discussed    Problem Solving Eating Pattern      Patient Self-Evaluation of Goals - Patient rates self as meeting previously set goals (% of time)   Nutrition 25 - 50% (sometimes)    Physical Activity 25 - 50% (sometimes)    Medications >75% (most of the time)    Monitoring >75% (most of the time)    Problem Solving and behavior change strategies  25 - 50% (sometimes)    Reducing Risk (treating acute and chronic complications) 50 - 75 % (half of the time)   Improved after hyperglycemic event   Health Coping 25 - 50% (sometimes)      Post-Education Assessment   Patient understands the diabetes disease and treatment process. Comprehends key points    Patient understands incorporating nutritional management into lifestyle. Comprehends key points    Patient undertands incorporating physical activity into lifestyle. Needs Review    Patient understands using medications safely. Demonstrates understanding / competency    Patient understands monitoring blood glucose, interpreting and using results Comprehends key points    Patient understands prevention, detection, and treatment of acute complications. Comprehends key points    Patient understands prevention, detection, and treatment of chronic complications. Comprehends key points    Patient understands how to develop strategies to address psychosocial issues. Comprehends key points    Patient understands how to develop strategies to promote health/change behavior. Needs Review      Outcomes   Expected Outcomes Demonstrated interest in learning but significant barriers to change    Future DMSE 2 months    Program Status Not Completed      Subsequent Visit   Since your last visit have you continued or begun to take your medications as prescribed? Yes    Since your last visit have you had your blood pressure  checked? Yes    Is your most recent blood pressure lower, unchanged, or higher since your last visit? Lower    Since your last visit have you experienced any weight changes? No change    Since your last visit, are you checking your blood glucose at least once a day? Yes             Individualized Plan for Diabetes Self-Management Training:   Learning Objective:  Patient will have a greater understanding of diabetes self-management. Patient education plan is to attend individual and/or group sessions per assessed needs and concerns.   Plan:   Patient Instructions  Check your fasting blood sugar first thing in the morning before eating or drinking anything. Look for numbers under 130 mg/dL, and continue logging your numbers.  Your goal A1c is below 7.0%  If snacking on crackers, only have 1/2 of a short stack of Saltine crackers and pair it with a protein (yogurt, low fat cheese or meats, peanut butter).  Consider having a low-sugar protein shake as a snack or for breakfast (Premier, Glucerna, Splenda)  Keep up the great work drinking your water!!    Expected  Outcomes:  Demonstrated interest in learning but significant barriers to change  If problems or questions, patient to contact team via:  Phone and Email  Future DSME appointment: 2 months

## 2023-05-16 ENCOUNTER — Emergency Department: Payer: No Typology Code available for payment source

## 2023-05-16 ENCOUNTER — Encounter: Payer: Self-pay | Admitting: Emergency Medicine

## 2023-05-16 ENCOUNTER — Emergency Department
Admission: EM | Admit: 2023-05-16 | Discharge: 2023-05-16 | Disposition: A | Discharge status: Home / Self Care | Payer: No Typology Code available for payment source | Attending: Emergency Medicine | Admitting: Emergency Medicine

## 2023-05-16 ENCOUNTER — Other Ambulatory Visit: Payer: Self-pay

## 2023-05-16 DIAGNOSIS — Z20822 Contact with and (suspected) exposure to covid-19: Secondary | ICD-10-CM | POA: Diagnosis not present

## 2023-05-16 DIAGNOSIS — J9811 Atelectasis: Secondary | ICD-10-CM | POA: Diagnosis not present

## 2023-05-16 DIAGNOSIS — E119 Type 2 diabetes mellitus without complications: Secondary | ICD-10-CM | POA: Insufficient documentation

## 2023-05-16 DIAGNOSIS — R0602 Shortness of breath: Secondary | ICD-10-CM | POA: Diagnosis not present

## 2023-05-16 DIAGNOSIS — R55 Syncope and collapse: Secondary | ICD-10-CM | POA: Diagnosis not present

## 2023-05-16 DIAGNOSIS — J40 Bronchitis, not specified as acute or chronic: Secondary | ICD-10-CM | POA: Insufficient documentation

## 2023-05-16 DIAGNOSIS — M62830 Muscle spasm of back: Secondary | ICD-10-CM | POA: Diagnosis not present

## 2023-05-16 DIAGNOSIS — R531 Weakness: Secondary | ICD-10-CM | POA: Diagnosis not present

## 2023-05-16 DIAGNOSIS — M5416 Radiculopathy, lumbar region: Secondary | ICD-10-CM | POA: Diagnosis not present

## 2023-05-16 DIAGNOSIS — R5383 Other fatigue: Secondary | ICD-10-CM | POA: Diagnosis not present

## 2023-05-16 DIAGNOSIS — M48062 Spinal stenosis, lumbar region with neurogenic claudication: Secondary | ICD-10-CM | POA: Diagnosis not present

## 2023-05-16 DIAGNOSIS — M47816 Spondylosis without myelopathy or radiculopathy, lumbar region: Secondary | ICD-10-CM | POA: Diagnosis not present

## 2023-05-16 DIAGNOSIS — J101 Influenza due to other identified influenza virus with other respiratory manifestations: Secondary | ICD-10-CM | POA: Insufficient documentation

## 2023-05-16 LAB — RESP PANEL BY RT-PCR (RSV, FLU A&B, COVID)  RVPGX2
Influenza A by PCR: POSITIVE — AB
Influenza B by PCR: NEGATIVE
Resp Syncytial Virus by PCR: NEGATIVE
SARS Coronavirus 2 by RT PCR: NEGATIVE

## 2023-05-16 LAB — CBC
HCT: 39.9 % (ref 36.0–46.0)
Hemoglobin: 12.9 g/dL (ref 12.0–15.0)
MCH: 29.5 pg (ref 26.0–34.0)
MCHC: 32.3 g/dL (ref 30.0–36.0)
MCV: 91.3 fL (ref 80.0–100.0)
Platelets: 308 10*3/uL (ref 150–400)
RBC: 4.37 MIL/uL (ref 3.87–5.11)
RDW: 13.2 % (ref 11.5–15.5)
WBC: 4.5 10*3/uL (ref 4.0–10.5)
nRBC: 0 % (ref 0.0–0.2)

## 2023-05-16 LAB — BASIC METABOLIC PANEL
Anion gap: 13 (ref 5–15)
BUN: 17 mg/dL (ref 8–23)
CO2: 29 mmol/L (ref 22–32)
Calcium: 10 mg/dL (ref 8.9–10.3)
Chloride: 104 mmol/L (ref 98–111)
Creatinine, Ser: 0.68 mg/dL (ref 0.44–1.00)
GFR, Estimated: >60 mL/min (ref >60)
Glucose, Bld: 139 mg/dL — ABNORMAL HIGH (ref 70–99)
Potassium: 3.3 mmol/L — ABNORMAL LOW (ref 3.5–5.1)
Sodium: 146 mmol/L — ABNORMAL HIGH (ref 135–145)

## 2023-05-16 MED ORDER — AZITHROMYCIN 250 MG PO TABS: ORAL_TABLET | ORAL | 0 refills | Status: AC

## 2023-05-16 MED ORDER — IPRATROPIUM-ALBUTEROL 0.5-2.5 (3) MG/3ML IN SOLN
3.0000 mL | Freq: Once | RESPIRATORY_TRACT | Status: AC
  Administered 2023-05-16: 3 mL via RESPIRATORY_TRACT
  Filled 2023-05-16: qty 3

## 2023-05-16 MED ORDER — PREDNISONE 20 MG PO TABS
60.0000 mg | ORAL_TABLET | Freq: Once | ORAL | Status: AC
Start: 2023-05-16 — End: 2023-05-16
  Administered 2023-05-16: 60 mg via ORAL
  Filled 2023-05-16: qty 3

## 2023-05-16 MED ORDER — ALBUTEROL SULFATE HFA 108 (90 BASE) MCG/ACT IN AERS: 2.0000 | INHALATION_SPRAY | Freq: Four times a day (QID) | RESPIRATORY_TRACT | 2 refills | Status: AC | PRN

## 2023-05-16 MED ORDER — PREDNISONE 10 MG (21) PO TBPK: ORAL_TABLET | ORAL | 0 refills | Status: AC

## 2023-05-16 MED ORDER — AEROCHAMBER MV MISC
0 refills | Status: AC
Start: 2023-05-16 — End: ?

## 2023-05-16 NOTE — ED Triage Notes (Signed)
 Pt in via POV, reports ongoing dizziness, generalized weakness x approximately 2 weeks.  Patient tachypneic in triage, appears fatigued, vitals WDL.

## 2023-05-16 NOTE — ED Triage Notes (Signed)
 First nurse note: Pt from [facility]. KC reports pt seen for increasing weakness, SOB/wheezing  x2 wks.

## 2023-05-16 NOTE — ED Provider Notes (Signed)
 [facility] Provider Note   Event Date/Time   First MD Initiated Contact with Patient 05/16/23 1712     (approximate) History  Dizziness and Weakness  HPI Ellen Garcia is a 68 y.o. female with a past medical history of morbid obesity and type 2 diabetes who presents complaining of shortness of breath, fever, and sore throat for for the last 2 days and was sent here from [facility] clinic for wheezing and increased respiratory rate.  Patient denies any recent travel, sick contacts, or food out of the ordinary.  Patient denies any history of asthma, COPD, or tobacco abuse. ROS: Patient currently denies any vision changes, tinnitus, difficulty speaking, facial droop, chest pain, abdominal pain, nausea/vomiting/diarrhea, dysuria, or weakness/numbness/paresthesias in any extremity   Physical Exam  Triage Vital Signs: ED Triage Vitals [05/16/23 1357]  Encounter Vitals Group     BP (!) 150/86     Systolic BP Percentile      Diastolic BP Percentile      Pulse Rate 86     Resp (!) 22     Temp 98.5 F (36.9 C)     Temp Source Oral     SpO2 97 %     Weight 287 lb (130.2 kg)     Height 5\' 7"  (1.702 m)     Head Circumference      Peak Flow      Pain Score 0     Pain Loc      Pain Education      Exclude from Growth Chart    Most recent vital signs: Vitals:   05/16/23 1357 05/16/23 1810  BP: (!) 150/86 (!) 148/80  Pulse: 86 80  Resp: (!) 22 20  Temp: 98.5 F (36.9 C) 98 F (36.7 C)  SpO2: 97% 98%   General: Awake, oriented x4. CV:  Good peripheral perfusion.  Resp:  Mildly tachypneic.  Mild inspiratory and expiratory wheezing over the bilateral upper lung fields Abd:  No distention.  Other:  Morbidly obese elderly African-American female resting in [place] in mild respiratory distress ED Results / Procedures / Treatments  Labs (all labs ordered are listed, but only abnormal results are displayed) Labs Reviewed  RESP PANEL BY RT-PCR (RSV, FLU A&B,  COVID)  RVPGX2 - Abnormal; Notable for the following components:      Result Value   Influenza A by PCR POSITIVE (*)    All other components within normal limits  BASIC METABOLIC PANEL - Abnormal; Notable for the following components:   Sodium 146 (*)    Potassium 3.3 (*)    Glucose, Bld 139 (*)    All other components within normal limits  CBC  URINALYSIS, ROUTINE W REFLEX MICROSCOPIC  CBG MONITORING, ED   EKG ED ECG REPORT I, Charleen Conn, the attending physician, personally viewed and interpreted this ECG. Date: 05/16/2023 EKG Time: 1401 Rate: 78 Rhythm: normal sinus rhythm QRS Axis: normal Intervals: normal ST/T Wave abnormalities: normal Narrative Interpretation: no evidence of acute ischemia RADIOLOGY ED MD interpretation: One-view portable chest x-ray interpreted by me shows no evidence of acute abnormalities including no pneumonia, pneumothorax, or widened mediastinum -Agree with radiology assessment Official radiology report(s): DG Chest Port 1 View Result Date: 05/16/2023 CLINICAL DATA:  SHOB, influenza A positive EXAM: PORTABLE CHEST 1 VIEW COMPARISON:  Chest x-ray 04/16/2022 FINDINGS: The heart and mediastinal contours are unchanged. Left base atelectasis. No focal consolidation. No pulmonary edema. No pleural effusion. No pneumothorax. No acute osseous  abnormality. IMPRESSION: No active disease. Electronically Signed   By: Morgane  Naveau M.D.   On: 05/16/2023 18:57   PROCEDURES: Critical Care performed: No Procedures MEDICATIONS ORDERED IN ED: Medications  ipratropium-albuterol  (DUONEB) 0.5-2.5 (3) MG/3ML nebulizer solution 3 mL (3 mLs Nebulization Given 05/16/23 1805)  predniSONE  (DELTASONE ) tablet 60 mg (60 mg Oral Given 05/16/23 1805)   IMPRESSION / MDM / ASSESSMENT AND PLAN / ED COURSE  I reviewed the triage vital signs and the nursing notes.                             The patient is on the cardiac monitor to evaluate for evidence of arrhythmia and/or  significant heart rate changes. Patient's presentation is most consistent with acute presentation with potential threat to life or bodily function. Presentation most consistent with Viral Syndrome.  Patient has tested positive for influenza A Based on vitals and exam they are nontoxic and stable for discharge.  Given History and Exam I have a lower suspicion for: Emergent CardioPulmonary causes [such as Acute Asthma or COPD Exacerbation, acute Heart Failure or exacerbation, PE, PTX, atypical ACS, PNA]. Emergent Otolaryngeal causes [such as PTA, RPA, Ludwigs, Epiglottitis, EBV].  Regarding Emergent Travel or Immunosuppressive related infectious: I have a low suspicion for acute HIV.  Patient had no episodes of of presyncope or orthostatic vital sign changes.  Patient did have mild wheezing on exam that was partially cleared with DuoNeb treatment via nebulizer.  Patient encouraged to continue albuterol  inhaler with AeroChamber at home.  Patient also will be given a prednisone  Dosepak and Z-Pak as well as instructions to follow-up with their primary care provider for further evaluation and management as needed Will provide strict return precautions and instructions on self-isolation/quarantine and anticipatory guidance.  Dispo: Discharge home with PCP follow-up   FINAL CLINICAL IMPRESSION(S) / ED DIAGNOSES   Final diagnoses:  Bronchitis with acute wheezing  Influenza A virus present   Rx / DC Orders   ED Discharge Orders          Ordered    albuterol  (VENTOLIN  HFA) 108 (90 Base) MCG/ACT inhaler  Every 6 hours PRN        05/16/23 1948    Spacer/Aero-Holding Chambers (AEROCHAMBER MV) inhaler        05/16/23 1948    predniSONE  (STERAPRED UNI-PAK 21 TAB) 10 MG (21) TBPK tablet        05/16/23 1948    azithromycin  (ZITHROMAX  Z-PAK) 250 MG tablet        05/16/23 1948           Note:  This document was prepared using Dragon voice recognition software and may include unintentional  dictation errors.   Charleen Conn, MD 05/16/23 [phone]

## 2023-05-23 DIAGNOSIS — M0579 Rheumatoid arthritis with rheumatoid factor of multiple sites without organ or systems involvement: Secondary | ICD-10-CM | POA: Diagnosis not present

## 2023-05-23 DIAGNOSIS — E119 Type 2 diabetes mellitus without complications: Secondary | ICD-10-CM | POA: Diagnosis not present

## 2023-05-23 DIAGNOSIS — K219 Gastro-esophageal reflux disease without esophagitis: Secondary | ICD-10-CM | POA: Diagnosis not present

## 2023-05-23 DIAGNOSIS — I10 Essential (primary) hypertension: Secondary | ICD-10-CM | POA: Diagnosis not present

## 2023-05-23 DIAGNOSIS — J40 Bronchitis, not specified as acute or chronic: Secondary | ICD-10-CM | POA: Diagnosis not present

## 2023-05-23 DIAGNOSIS — E1142 Type 2 diabetes mellitus with diabetic polyneuropathy: Secondary | ICD-10-CM | POA: Diagnosis not present

## 2023-05-23 DIAGNOSIS — D649 Anemia, unspecified: Secondary | ICD-10-CM | POA: Diagnosis not present

## 2023-05-31 DIAGNOSIS — R61 Generalized hyperhidrosis: Secondary | ICD-10-CM | POA: Diagnosis not present

## 2023-05-31 DIAGNOSIS — M0579 Rheumatoid arthritis with rheumatoid factor of multiple sites without organ or systems involvement: Secondary | ICD-10-CM | POA: Diagnosis not present

## 2023-05-31 DIAGNOSIS — Z79899 Other long term (current) drug therapy: Secondary | ICD-10-CM | POA: Diagnosis not present

## 2023-07-27 DIAGNOSIS — Z6830 Body mass index (BMI) 30.0-30.9, adult: Secondary | ICD-10-CM | POA: Diagnosis not present

## 2023-07-27 DIAGNOSIS — Z008 Encounter for other general examination: Secondary | ICD-10-CM | POA: Diagnosis not present

## 2023-07-27 DIAGNOSIS — D84821 Immunodeficiency due to drugs: Secondary | ICD-10-CM | POA: Diagnosis not present

## 2023-07-27 DIAGNOSIS — E1142 Type 2 diabetes mellitus with diabetic polyneuropathy: Secondary | ICD-10-CM | POA: Diagnosis not present

## 2023-07-27 DIAGNOSIS — I10 Essential (primary) hypertension: Secondary | ICD-10-CM | POA: Diagnosis not present

## 2023-07-27 DIAGNOSIS — E669 Obesity, unspecified: Secondary | ICD-10-CM | POA: Diagnosis not present

## 2023-07-27 DIAGNOSIS — R2681 Unsteadiness on feet: Secondary | ICD-10-CM | POA: Diagnosis not present

## 2023-07-27 DIAGNOSIS — F17211 Nicotine dependence, cigarettes, in remission: Secondary | ICD-10-CM | POA: Diagnosis not present

## 2023-07-27 DIAGNOSIS — M069 Rheumatoid arthritis, unspecified: Secondary | ICD-10-CM | POA: Diagnosis not present

## 2023-09-28 DIAGNOSIS — Z79899 Other long term (current) drug therapy: Secondary | ICD-10-CM | POA: Diagnosis not present

## 2023-09-28 DIAGNOSIS — M0579 Rheumatoid arthritis with rheumatoid factor of multiple sites without organ or systems involvement: Secondary | ICD-10-CM | POA: Diagnosis not present

## 2023-09-28 DIAGNOSIS — G629 Polyneuropathy, unspecified: Secondary | ICD-10-CM | POA: Diagnosis not present

## 2023-10-27 ENCOUNTER — Other Ambulatory Visit: Payer: Self-pay | Admitting: Internal Medicine

## 2023-10-27 DIAGNOSIS — Z1231 Encounter for screening mammogram for malignant neoplasm of breast: Secondary | ICD-10-CM

## 2023-11-11 ENCOUNTER — Ambulatory Visit
Admission: RE | Admit: 2023-11-11 | Discharge: 2023-11-11 | Disposition: A | Discharge status: Home / Self Care | Source: Ambulatory Visit | Attending: Internal Medicine | Admitting: Internal Medicine

## 2023-11-11 DIAGNOSIS — Z1231 Encounter for screening mammogram for malignant neoplasm of breast: Secondary | ICD-10-CM | POA: Diagnosis not present

## 2023-11-15 DIAGNOSIS — E119 Type 2 diabetes mellitus without complications: Secondary | ICD-10-CM | POA: Diagnosis not present

## 2023-11-17 DIAGNOSIS — M48062 Spinal stenosis, lumbar region with neurogenic claudication: Secondary | ICD-10-CM | POA: Diagnosis not present

## 2023-11-17 DIAGNOSIS — M5416 Radiculopathy, lumbar region: Secondary | ICD-10-CM | POA: Diagnosis not present

## 2023-11-22 DIAGNOSIS — I10 Essential (primary) hypertension: Secondary | ICD-10-CM | POA: Diagnosis not present

## 2023-11-22 DIAGNOSIS — M0579 Rheumatoid arthritis with rheumatoid factor of multiple sites without organ or systems involvement: Secondary | ICD-10-CM | POA: Diagnosis not present

## 2023-11-22 DIAGNOSIS — K219 Gastro-esophageal reflux disease without esophagitis: Secondary | ICD-10-CM | POA: Diagnosis not present

## 2023-11-22 DIAGNOSIS — E119 Type 2 diabetes mellitus without complications: Secondary | ICD-10-CM | POA: Diagnosis not present

## 2023-11-22 DIAGNOSIS — Z1331 Encounter for screening for depression: Secondary | ICD-10-CM | POA: Diagnosis not present

## 2023-11-22 DIAGNOSIS — E1142 Type 2 diabetes mellitus with diabetic polyneuropathy: Secondary | ICD-10-CM | POA: Diagnosis not present

## 2023-11-22 DIAGNOSIS — Z0001 Encounter for general adult medical examination with abnormal findings: Secondary | ICD-10-CM | POA: Diagnosis not present

## 2023-11-22 DIAGNOSIS — D649 Anemia, unspecified: Secondary | ICD-10-CM | POA: Diagnosis not present

## 2023-11-22 DIAGNOSIS — H9192 Unspecified hearing loss, left ear: Secondary | ICD-10-CM | POA: Diagnosis not present

## 2023-12-20 DIAGNOSIS — H903 Sensorineural hearing loss, bilateral: Secondary | ICD-10-CM | POA: Diagnosis not present

## 2023-12-20 DIAGNOSIS — R430 Anosmia: Secondary | ICD-10-CM | POA: Diagnosis not present

## 2023-12-20 DIAGNOSIS — H90A22 Sensorineural hearing loss, unilateral, left ear, with restricted hearing on the contralateral side: Secondary | ICD-10-CM | POA: Diagnosis not present

## 2023-12-20 DIAGNOSIS — H6982 Other specified disorders of Eustachian tube, left ear: Secondary | ICD-10-CM | POA: Diagnosis not present

## 2023-12-21 ENCOUNTER — Other Ambulatory Visit: Payer: Self-pay | Admitting: Otolaryngology

## 2023-12-21 DIAGNOSIS — H90A22 Sensorineural hearing loss, unilateral, left ear, with restricted hearing on the contralateral side: Secondary | ICD-10-CM

## 2023-12-21 DIAGNOSIS — R430 Anosmia: Secondary | ICD-10-CM

## 2023-12-26 ENCOUNTER — Encounter: Payer: Self-pay | Admitting: Otolaryngology

## 2023-12-29 DIAGNOSIS — Z79899 Other long term (current) drug therapy: Secondary | ICD-10-CM | POA: Diagnosis not present

## 2023-12-29 DIAGNOSIS — G8929 Other chronic pain: Secondary | ICD-10-CM | POA: Diagnosis not present

## 2023-12-29 DIAGNOSIS — M0579 Rheumatoid arthritis with rheumatoid factor of multiple sites without organ or systems involvement: Secondary | ICD-10-CM | POA: Diagnosis not present

## 2023-12-29 DIAGNOSIS — M79671 Pain in right foot: Secondary | ICD-10-CM | POA: Diagnosis not present

## 2024-01-04 ENCOUNTER — Ambulatory Visit
Admission: RE | Admit: 2024-01-04 | Discharge: 2024-01-04 | Disposition: A | Discharge status: Home / Self Care | Source: Ambulatory Visit | Attending: Otolaryngology | Admitting: Otolaryngology

## 2024-01-04 DIAGNOSIS — H90A22 Sensorineural hearing loss, unilateral, left ear, with restricted hearing on the contralateral side: Secondary | ICD-10-CM

## 2024-01-04 DIAGNOSIS — R430 Anosmia: Secondary | ICD-10-CM

## 2024-01-04 MED ORDER — GADOPICLENOL 0.5 MMOL/ML IV SOLN
10.0000 mL | Freq: Once | INTRAVENOUS | Status: AC | PRN
  Administered 2024-01-04: 10 mL via INTRAVENOUS

## 2024-01-05 DIAGNOSIS — M19172 Post-traumatic osteoarthritis, left ankle and foot: Secondary | ICD-10-CM | POA: Diagnosis not present

## 2024-01-05 DIAGNOSIS — E119 Type 2 diabetes mellitus without complications: Secondary | ICD-10-CM | POA: Diagnosis not present

## 2024-01-05 DIAGNOSIS — G5793 Unspecified mononeuropathy of bilateral lower limbs: Secondary | ICD-10-CM | POA: Diagnosis not present

## 2024-01-05 DIAGNOSIS — E1142 Type 2 diabetes mellitus with diabetic polyneuropathy: Secondary | ICD-10-CM | POA: Diagnosis not present

## 2024-01-06 DIAGNOSIS — E1142 Type 2 diabetes mellitus with diabetic polyneuropathy: Secondary | ICD-10-CM | POA: Diagnosis not present

## 2024-01-09 DIAGNOSIS — H6982 Other specified disorders of Eustachian tube, left ear: Secondary | ICD-10-CM | POA: Diagnosis not present

## 2024-01-09 DIAGNOSIS — H90A32 Mixed conductive and sensorineural hearing loss, unilateral, left ear with restricted hearing on the contralateral side: Secondary | ICD-10-CM | POA: Diagnosis not present
